# Patient Record
Sex: Male | Born: 1965 | Race: White | Hispanic: No | Smoking: Never smoker
Health system: Southern US, Community
[De-identification: ages and names within clinical notes are randomized; demographics above are authoritative.]

## PROBLEM LIST (undated history)

## (undated) DIAGNOSIS — I10 Essential (primary) hypertension: Secondary | ICD-10-CM

## (undated) HISTORY — PX: HERNIA REPAIR: SHX51

## (undated) HISTORY — DX: Essential (primary) hypertension: I10

---
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
## 2012-02-21 ENCOUNTER — Other Ambulatory Visit (HOSPITAL_COMMUNITY): Payer: Self-pay | Admitting: Sports Medicine

## 2012-02-21 ENCOUNTER — Ambulatory Visit (HOSPITAL_COMMUNITY)
Admission: RE | Admit: 2012-02-21 | Discharge: 2012-02-21 | Disposition: A | Discharge status: Home / Self Care | Payer: PRIVATE HEALTH INSURANCE | Source: Ambulatory Visit | Attending: Sports Medicine | Admitting: Sports Medicine

## 2012-02-21 DIAGNOSIS — M25572 Pain in left ankle and joints of left foot: Secondary | ICD-10-CM

## 2012-02-21 DIAGNOSIS — Z1389 Encounter for screening for other disorder: Secondary | ICD-10-CM | POA: Insufficient documentation

## 2012-02-21 DIAGNOSIS — H0550 Retained (old) foreign body following penetrating wound of unspecified orbit: Secondary | ICD-10-CM | POA: Insufficient documentation

## 2012-02-28 ENCOUNTER — Ambulatory Visit (HOSPITAL_COMMUNITY)
Admission: RE | Admit: 2012-02-28 | Discharge: 2012-02-28 | Disposition: A | Discharge status: Home / Self Care | Payer: PRIVATE HEALTH INSURANCE | Source: Ambulatory Visit | Attending: Sports Medicine | Admitting: Sports Medicine

## 2012-02-28 ENCOUNTER — Other Ambulatory Visit (HOSPITAL_COMMUNITY): Payer: Self-pay | Admitting: Sports Medicine

## 2012-02-28 DIAGNOSIS — Z1389 Encounter for screening for other disorder: Secondary | ICD-10-CM | POA: Insufficient documentation

## 2012-02-28 DIAGNOSIS — R52 Pain, unspecified: Secondary | ICD-10-CM

## 2012-02-28 DIAGNOSIS — M948X9 Other specified disorders of cartilage, unspecified sites: Secondary | ICD-10-CM | POA: Insufficient documentation

## 2018-03-14 HISTORY — PX: SKIN CANCER EXCISION: SHX779

## 2019-01-22 ENCOUNTER — Ambulatory Visit: Payer: BC Managed Care – PPO | Admitting: Plastic Surgery

## 2019-01-22 ENCOUNTER — Other Ambulatory Visit: Payer: Self-pay

## 2019-01-22 ENCOUNTER — Encounter: Payer: Self-pay | Admitting: Plastic Surgery

## 2019-01-22 DIAGNOSIS — M952 Other acquired deformity of head: Secondary | ICD-10-CM

## 2019-01-22 DIAGNOSIS — Z9889 Other specified postprocedural states: Secondary | ICD-10-CM

## 2019-01-22 HISTORY — DX: Other specified postprocedural states: Z98.890

## 2019-01-22 NOTE — Progress Notes (Signed)
     Patient ID: Tony Cook, male    DOB: Nov 09, 1965, 53 y.o.   MRN: [id number]   Chief Complaint  Patient presents with  . Advice Only    for MOHS repair    The patient is a 53 year old man here with family for evaluation of the scalp.  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  The margins were positive at the time and he went on to radiation.  He has 2 more weeks of radiation scheduled.  The area is 4.5 x 5 cm with bone exposed.  There is an additional 2 cm circumferentially of radiation damage.  Does not appear infected.  The bone does appear dried out and dark in color.  He is otherwise in good health.  He states that he stopped smoking years ago.  He has been using Neosporin to the area and washing it daily.  It is slightly tender to touch.  He has hair loss that includes the wound and the 2 cm circumferential area.   Review of Systems  Constitutional: Negative.  Negative for activity change.  HENT: Negative.   Eyes: Negative.   Respiratory: Negative.  Negative for chest tightness.   Cardiovascular: Negative.   Gastrointestinal: Negative.   Endocrine: Negative.   Genitourinary: Negative.   Musculoskeletal: Negative.   Skin: Positive for color change and wound.  Neurological: Negative.   Hematological: Negative.   Psychiatric/Behavioral: Negative.     History reviewed. No pertinent past medical history.  History reviewed. No pertinent surgical history.   No current outpatient medications on file.   Objective:   Vitals:   01/22/19 1334  BP: (!) 167/92  Pulse: 71  Temp: 98.2 F (36.8 C)  SpO2: 99%    Physical Exam Vitals signs and nursing note reviewed.  Constitutional:      Appearance: Normal appearance.  HENT:     Head: Normocephalic.  Cardiovascular:     Rate and Rhythm: Normal rate.     Pulses: Normal pulses.  Pulmonary:     Effort: Pulmonary effort is normal.  Abdominal:     General: Abdomen is flat. There is no distension.   Tenderness: There is no abdominal tenderness.  Neurological:     General: No focal deficit present.     Mental Status: He is alert.  Psychiatric:        Mood and Affect: Mood normal.        Behavior: Behavior normal.     Assessment & Plan:  Mohs defect of left scalp  We discussed the options for reconstruction.  Due to his radiation it will make things tricky and his healing process much slower than it would have otherwise been.  Recommend debridement of the area with burring down of bone at the outer table and then placement of ACell.  With the amount of radiation damage I am concerned about doing a skin flap as it would have to be large enough to be outside the radiated area.  Pictures were obtained of the patient and placed in the chart with the patient's or guardian's permission.  [place], DO

## 2019-03-04 ENCOUNTER — Encounter (HOSPITAL_BASED_OUTPATIENT_CLINIC_OR_DEPARTMENT_OTHER): Payer: Self-pay | Admitting: Plastic Surgery

## 2019-03-04 ENCOUNTER — Other Ambulatory Visit: Payer: Self-pay

## 2019-03-04 ENCOUNTER — Telehealth: Payer: Self-pay | Admitting: Plastic Surgery

## 2019-03-04 NOTE — Telephone Encounter (Signed)

## 2019-03-05 ENCOUNTER — Encounter: Payer: Self-pay | Admitting: Nurse Practitioner

## 2019-03-05 ENCOUNTER — Ambulatory Visit (INDEPENDENT_AMBULATORY_CARE_PROVIDER_SITE_OTHER): Payer: BC Managed Care – PPO | Admitting: Nurse Practitioner

## 2019-03-05 VITALS — BP 159/90 | HR 83 | Temp 97.3°F | Ht 67.0 in | Wt 172.6 lb

## 2019-03-05 DIAGNOSIS — Z9889 Other specified postprocedural states: Secondary | ICD-10-CM

## 2019-03-05 DIAGNOSIS — M952 Other acquired deformity of head: Secondary | ICD-10-CM

## 2019-03-05 MED ORDER — HYDROCODONE-ACETAMINOPHEN 5-325 MG PO TABS: 1.0000 | ORAL_TABLET | Freq: Four times a day (QID) | ORAL | 0 refills | Status: AC | PRN

## 2019-03-05 NOTE — Progress Notes (Signed)
ICD-10-CM   1. Mohs defect of left scalp  M95.2      History of Present Illness: Tony Cook is a 53 y.o.  male  with a history of squamous cell carcinoma of the scalp. He underwent an excision of the area in September 2020. The margins were positive and he underwent 6 weeks of radiation. There is a 5.2 cm x 4.5 cm area of exposed bone. He has been covering the wound with vaseline and gauze. He presents with his sister for preoperative evaluation for his upcoming procedure, Debridement of Moh's defect of scalp with Integra placement, scheduled for 03/13/19 with Dr. Ulice Bold.  No other past medical history. Previous surgical history includes; inguinal hernia repair as a child. Patient does not recall any adverse reactions to anesthesia. Denies any family history of adverse reactions to anesthesia. Patient denies any personal or family history of blood clots. Patient denies any history of stroke or MI. Patient uses snuff tobacco. Patient drinks 5-6 cans of beer and "an occasional shot" per day. Patient began taking a multivitamin and vitamin C last month. Patient states he was instructed by pre-op staff to stop taking all medications until after surgery. Patient states he only needed "stronger" pain medication for about 3 days after the excision.    Past Medical History: Allergies: No Known Allergies  Current Medications:  Current Outpatient Medications:  .  magnesium 30 MG tablet, Take 30 mg by mouth 2 (two) times daily., Disp: , Rfl:  .  Multiple Vitamins-Minerals (MULTIVITAMIN WITH MINERALS) tablet, Take 1 tablet by mouth daily., Disp: , Rfl:  .  vitamin B-12 (CYANOCOBALAMIN) 500 MCG tablet, Take 500 mcg by mouth daily., Disp: , Rfl:  .  vitamin C (ASCORBIC ACID) 250 MG tablet, Take 250 mg by mouth daily., Disp: , Rfl:   Past Medical Problems: History reviewed. No pertinent past medical history.  Past Surgical History: Past Surgical History:  Procedure Laterality Date  . HERNIA  REPAIR      The patient has had anesthesia or sedation in the past.   The patient has NOT had problems with anesthesia.  The patient does NOT have a family history of anesthesia problems.    Social History: Social History   Socioeconomic History  . Marital status: Divorced    Spouse name: Not on file  . Number of children: Not on file  . Years of education: Not on file  . Highest education level: Not on file  Occupational History  . Not on file  Tobacco Use  . Smoking status: Never Smoker  . Smokeless tobacco: Current User    Types: Snuff  Substance and Sexual Activity  . Alcohol use: Yes  . Drug use: Never  . Sexual activity: Not on file  Other Topics Concern  . Not on file  Social History Narrative  . Not on file   Social Determinants of Health   Financial Resource Strain:   . Difficulty of Paying Living Expenses: Not on file  Food Insecurity:   . Worried About Programme researcher, broadcasting/film/video in the Last Year: Not on file  . Ran Out of Food in the Last Year: Not on file  Transportation Needs:   . Lack of Transportation (Medical): Not on file  . Lack of Transportation (Non-Medical): Not on file  Physical Activity:   . Days of Exercise per Week: Not on file  . Minutes of Exercise per Session: Not on file  Stress:   . Feeling of Stress : Not  on file  Social Connections:   . Frequency of Communication with Friends and Family: Not on file  . Frequency of Social Gatherings with Friends and Family: Not on file  . Attends Religious Services: Not on file  . Active Member of Clubs or Organizations: Not on file  . Attends Archivist Meetings: Not on file  . Marital Status: Not on file  Intimate Partner Violence:   . Fear of Current or Ex-Partner: Not on file  . Emotionally Abused: Not on file  . Physically Abused: Not on file  . Sexually Abused: Not on file    Family History: History reviewed. No pertinent family history.  Review of Systems: General ROS:  negative Dermatological ROS: positive for scalp exicisonal wound Cardiovascular ROS: no chest pain or dyspnea on exertion ENT ROS: negative Gastrointestinal ROS: no abdominal pain, change in bowel habits, or black or bloody stools  Physical Exam: Vital Signs BP (!) 159/90 (BP Location: Left Arm, Patient Position: Sitting, Cuff Size: Large)   Pulse 83   Temp (!) 97.3 F (36.3 C) (Temporal)   Ht 5\' 7"  (1.702 m)   Wt 172 lb 9.6 oz (78.3 kg)   SpO2 100%   BMI 27.03 kg/m  General: awake, alert, appears stated age, no acute distress HEENT: 5.2 cm x 4.5 cm scalp wound with exposed bone, mild erythema along wound borders Neck: supple, full ROM Chest: symmetrical rise and fall Cardiac: regular rate and rhythm, +2 radial pulses bilaterally Lungs: CTA throughout Abdomen: soft, non-distended, non-tender Musculoskeletal: MAE x4 Neuro: A&Ox3 Skin: warm, dry  Assessment: 53 y.o.male  with a history of squamous cell carcinoma of the scalp s/p excision and radiation. The surrounding skin erythema has significantly improved since his initial consultation. No other significant past medical history.   Caprini score=3, patient will require mechanical prophylaxis during surgery  Plan: Patient scheduled for Debridement of Moh's defect of scalp with Integra placement on 03/13/19 with Dr. Marla Roe.  COVID-19 pre-procedure test scheduled for 03/11/19. Patient understands he must quarantine until surgery date following COVID test. Post-op visit scheduled for 03/22/19 and 03/29/19. Discussed importance of increased protein and decreased carb and sugar intake for wound healing. Post-op instruction handout provided. Post-op medications ordered. Patient instructed to resume vitamins after surgery.   Risks, benefits, and alternatives of procedure discussed, expected recovery, and questions answered.   The risks that can be encountered with and after a debridement were discussed and include the following but not  limited to these: bleeding, infection, delayed healing, anesthesia risks, skin sensation changes, injury to structures including nerves, blood vessels, and muscles which may be temporary or permanent, allergies to tape, suture materials and glues, blood products, topical preparations or injected agents, skin contour irregularities, skin discoloration and swelling, deep vein thrombosis, cardiac and pulmonary complications, pain, which may persist, and possible need for revisional surgery or staged procedures.    Electronically signed by: Alfredo Batty, NP 03/05/2019 1:40 PM

## 2019-03-05 NOTE — H&P (View-Only) (Signed)
ICD-10-CM   1. Mohs defect of left scalp  M95.2      History of Present Illness: Tony Cook is a 53 y.o.  male  with a history of squamous cell carcinoma of the scalp. He underwent an excision of the area in September 2020. The margins were positive and he underwent 6 weeks of radiation. There is a 5.2 cm x 4.5 cm area of exposed bone. He has been covering the wound with vaseline and gauze. He presents with his sister for preoperative evaluation for his upcoming procedure, Debridement of Moh's defect of scalp with Integra placement, scheduled for 03/13/19 with Dr. Ulice Bold.  No other past medical history. Previous surgical history includes; inguinal hernia repair as a child. Patient does not recall any adverse reactions to anesthesia. Denies any family history of adverse reactions to anesthesia. Patient denies any personal or family history of blood clots. Patient denies any history of stroke or MI. Patient uses snuff tobacco. Patient drinks 5-6 cans of beer and "an occasional shot" per day. Patient began taking a multivitamin and vitamin C last month. Patient states he was instructed by pre-op staff to stop taking all medications until after surgery. Patient states he only needed "stronger" pain medication for about 3 days after the excision.    Past Medical History: Allergies: No Known Allergies  Current Medications:  Current Outpatient Medications:  .  magnesium 30 MG tablet, Take 30 mg by mouth 2 (two) times daily., Disp: , Rfl:  .  Multiple Vitamins-Minerals (MULTIVITAMIN WITH MINERALS) tablet, Take 1 tablet by mouth daily., Disp: , Rfl:  .  vitamin B-12 (CYANOCOBALAMIN) 500 MCG tablet, Take 500 mcg by mouth daily., Disp: , Rfl:  .  vitamin C (ASCORBIC ACID) 250 MG tablet, Take 250 mg by mouth daily., Disp: , Rfl:   Past Medical Problems: History reviewed. No pertinent past medical history.  Past Surgical History: Past Surgical History:  Procedure Laterality Date  . HERNIA  REPAIR      The patient has had anesthesia or sedation in the past.   The patient has NOT had problems with anesthesia.  The patient does NOT have a family history of anesthesia problems.    Social History: Social History   Socioeconomic History  . Marital status: Divorced    Spouse name: Not on file  . Number of children: Not on file  . Years of education: Not on file  . Highest education level: Not on file  Occupational History  . Not on file  Tobacco Use  . Smoking status: Never Smoker  . Smokeless tobacco: Current User    Types: Snuff  Substance and Sexual Activity  . Alcohol use: Yes  . Drug use: Never  . Sexual activity: Not on file  Other Topics Concern  . Not on file  Social History Narrative  . Not on file   Social Determinants of Health   Financial Resource Strain:   . Difficulty of Paying Living Expenses: Not on file  Food Insecurity:   . Worried About Programme researcher, broadcasting/film/video in the Last Year: Not on file  . Ran Out of Food in the Last Year: Not on file  Transportation Needs:   . Lack of Transportation (Medical): Not on file  . Lack of Transportation (Non-Medical): Not on file  Physical Activity:   . Days of Exercise per Week: Not on file  . Minutes of Exercise per Session: Not on file  Stress:   . Feeling of Stress : Not  on file  Social Connections:   . Frequency of Communication with Friends and Family: Not on file  . Frequency of Social Gatherings with Friends and Family: Not on file  . Attends Religious Services: Not on file  . Active Member of Clubs or Organizations: Not on file  . Attends Archivist Meetings: Not on file  . Marital Status: Not on file  Intimate Partner Violence:   . Fear of Current or Ex-Partner: Not on file  . Emotionally Abused: Not on file  . Physically Abused: Not on file  . Sexually Abused: Not on file    Family History: History reviewed. No pertinent family history.  Review of Systems: General ROS:  negative Dermatological ROS: positive for scalp exicisonal wound Cardiovascular ROS: no chest pain or dyspnea on exertion ENT ROS: negative Gastrointestinal ROS: no abdominal pain, change in bowel habits, or black or bloody stools  Physical Exam: Vital Signs BP (!) 159/90 (BP Location: Left Arm, Patient Position: Sitting, Cuff Size: Large)   Pulse 83   Temp (!) 97.3 F (36.3 C) (Temporal)   Ht 5\' 7"  (1.702 m)   Wt 172 lb 9.6 oz (78.3 kg)   SpO2 100%   BMI 27.03 kg/m  General: awake, alert, appears stated age, no acute distress HEENT: 5.2 cm x 4.5 cm scalp wound with exposed bone, mild erythema along wound borders Neck: supple, full ROM Chest: symmetrical rise and fall Cardiac: regular rate and rhythm, +2 radial pulses bilaterally Lungs: CTA throughout Abdomen: soft, non-distended, non-tender Musculoskeletal: MAE x4 Neuro: A&Ox3 Skin: warm, dry  Assessment: 53 y.o.male  with a history of squamous cell carcinoma of the scalp s/p excision and radiation. The surrounding skin erythema has significantly improved since his initial consultation. No other significant past medical history.   Caprini score=3, patient will require mechanical prophylaxis during surgery  Plan: Patient scheduled for Debridement of Moh's defect of scalp with Integra placement on 03/13/19 with Dr. Marla Roe.  COVID-19 pre-procedure test scheduled for 03/11/19. Patient understands he must quarantine until surgery date following COVID test. Post-op visit scheduled for 03/22/19 and 03/29/19. Discussed importance of increased protein and decreased carb and sugar intake for wound healing. Post-op instruction handout provided. Post-op medications ordered. Patient instructed to resume vitamins after surgery.   Risks, benefits, and alternatives of procedure discussed, expected recovery, and questions answered.   The risks that can be encountered with and after a debridement were discussed and include the following but not  limited to these: bleeding, infection, delayed healing, anesthesia risks, skin sensation changes, injury to structures including nerves, blood vessels, and muscles which may be temporary or permanent, allergies to tape, suture materials and glues, blood products, topical preparations or injected agents, skin contour irregularities, skin discoloration and swelling, deep vein thrombosis, cardiac and pulmonary complications, pain, which may persist, and possible need for revisional surgery or staged procedures.    Electronically signed by: Alfredo Batty, NP 03/05/2019 1:40 PM

## 2019-03-11 ENCOUNTER — Other Ambulatory Visit (HOSPITAL_COMMUNITY)
Admission: RE | Admit: 2019-03-11 | Discharge: 2019-03-11 | Disposition: A | Discharge status: Home / Self Care | Payer: BC Managed Care – PPO | Source: Ambulatory Visit | Attending: Plastic Surgery | Admitting: Plastic Surgery

## 2019-03-11 DIAGNOSIS — Z01812 Encounter for preprocedural laboratory examination: Secondary | ICD-10-CM | POA: Insufficient documentation

## 2019-03-11 DIAGNOSIS — Z20828 Contact with and (suspected) exposure to other viral communicable diseases: Secondary | ICD-10-CM | POA: Insufficient documentation

## 2019-03-11 LAB — SARS CORONAVIRUS 2 (TAT 6-24 HRS): SARS Coronavirus 2: NEGATIVE

## 2019-03-13 ENCOUNTER — Encounter (HOSPITAL_BASED_OUTPATIENT_CLINIC_OR_DEPARTMENT_OTHER)
Admission: RE | Disposition: A | Discharge status: Home / Self Care | Payer: Self-pay | Source: Home / Self Care | Attending: Plastic Surgery

## 2019-03-13 ENCOUNTER — Ambulatory Visit (HOSPITAL_BASED_OUTPATIENT_CLINIC_OR_DEPARTMENT_OTHER)
Admission: RE | Admit: 2019-03-13 | Discharge: 2019-03-13 | Disposition: A | Discharge status: Home / Self Care | Payer: BC Managed Care – PPO | Attending: Plastic Surgery | Admitting: Plastic Surgery

## 2019-03-13 ENCOUNTER — Ambulatory Visit (HOSPITAL_BASED_OUTPATIENT_CLINIC_OR_DEPARTMENT_OTHER): Payer: BC Managed Care – PPO | Admitting: Anesthesiology

## 2019-03-13 ENCOUNTER — Other Ambulatory Visit: Payer: Self-pay

## 2019-03-13 ENCOUNTER — Encounter (HOSPITAL_BASED_OUTPATIENT_CLINIC_OR_DEPARTMENT_OTHER): Payer: Self-pay | Admitting: Plastic Surgery

## 2019-03-13 DIAGNOSIS — Z923 Personal history of irradiation: Secondary | ICD-10-CM | POA: Diagnosis not present

## 2019-03-13 DIAGNOSIS — F17290 Nicotine dependence, other tobacco product, uncomplicated: Secondary | ICD-10-CM | POA: Diagnosis not present

## 2019-03-13 DIAGNOSIS — M952 Other acquired deformity of head: Secondary | ICD-10-CM

## 2019-03-13 DIAGNOSIS — Z85828 Personal history of other malignant neoplasm of skin: Secondary | ICD-10-CM | POA: Insufficient documentation

## 2019-03-13 HISTORY — PX: INCISION AND DRAINAGE OF WOUND: SHX1803

## 2019-03-13 SURGERY — IRRIGATION AND DEBRIDEMENT WOUND
Anesthesia: General | Site: Scalp | Laterality: Left

## 2019-03-13 MED ORDER — FENTANYL CITRATE (PF) 100 MCG/2ML IJ SOLN
25.0000 ug | INTRAMUSCULAR | Status: DC | PRN
  Administered 2019-03-13 (×2): 50 ug via INTRAVENOUS

## 2019-03-13 MED ORDER — FENTANYL CITRATE (PF) 100 MCG/2ML IJ SOLN
INTRAMUSCULAR | Status: AC
  Filled 2019-03-13: qty 2

## 2019-03-13 MED ORDER — ACETAMINOPHEN 500 MG PO TABS
ORAL_TABLET | ORAL | Status: AC
  Filled 2019-03-13: qty 2

## 2019-03-13 MED ORDER — SODIUM CHLORIDE 0.9% FLUSH: 3.0000 mL | INTRAVENOUS | Status: DC | PRN

## 2019-03-13 MED ORDER — ACETAMINOPHEN 650 MG RE SUPP: 650.0000 mg | RECTAL | Status: DC | PRN

## 2019-03-13 MED ORDER — PROPOFOL 10 MG/ML IV BOLUS
INTRAVENOUS | Status: AC
  Filled 2019-03-13: qty 20

## 2019-03-13 MED ORDER — KETOROLAC TROMETHAMINE 30 MG/ML IJ SOLN: 30.0000 mg | Freq: Once | INTRAMUSCULAR | Status: DC | PRN

## 2019-03-13 MED ORDER — CHLORHEXIDINE GLUCONATE CLOTH 2 % EX PADS: 6.0000 | MEDICATED_PAD | Freq: Once | CUTANEOUS | Status: DC

## 2019-03-13 MED ORDER — SODIUM CHLORIDE 0.9 % IV SOLN: 250.0000 mL | INTRAVENOUS | Status: DC | PRN

## 2019-03-13 MED ORDER — CEFAZOLIN SODIUM-DEXTROSE 2-4 GM/100ML-% IV SOLN
2.0000 g | INTRAVENOUS | Status: AC
  Administered 2019-03-13: 2 g via INTRAVENOUS

## 2019-03-13 MED ORDER — OXYCODONE HCL 5 MG PO TABS
ORAL_TABLET | ORAL | Status: AC
  Filled 2019-03-13: qty 1

## 2019-03-13 MED ORDER — OXYCODONE HCL 5 MG PO TABS: 5.0000 mg | ORAL_TABLET | ORAL | Status: DC | PRN

## 2019-03-13 MED ORDER — MIDAZOLAM HCL 2 MG/2ML IJ SOLN
INTRAMUSCULAR | Status: AC
  Filled 2019-03-13: qty 2

## 2019-03-13 MED ORDER — LACTATED RINGERS IV SOLN: INTRAVENOUS | Status: DC

## 2019-03-13 MED ORDER — SODIUM CHLORIDE 0.9% FLUSH: 3.0000 mL | Freq: Two times a day (BID) | INTRAVENOUS | Status: DC

## 2019-03-13 MED ORDER — CEFAZOLIN SODIUM-DEXTROSE 2-4 GM/100ML-% IV SOLN
INTRAVENOUS | Status: AC
  Filled 2019-03-13: qty 100

## 2019-03-13 MED ORDER — ACETAMINOPHEN 500 MG PO TABS
1000.0000 mg | ORAL_TABLET | Freq: Once | ORAL | Status: AC
  Administered 2019-03-13: 09:00:00 1000 mg via ORAL

## 2019-03-13 MED ORDER — MIDAZOLAM HCL 5 MG/5ML IJ SOLN
INTRAMUSCULAR | Status: DC | PRN
  Administered 2019-03-13: 2 mg via INTRAVENOUS

## 2019-03-13 MED ORDER — LIDOCAINE HCL (CARDIAC) PF 100 MG/5ML IV SOSY
PREFILLED_SYRINGE | INTRAVENOUS | Status: DC | PRN
  Administered 2019-03-13: 70 mg via INTRAVENOUS

## 2019-03-13 MED ORDER — MEPERIDINE HCL 25 MG/ML IJ SOLN: 6.2500 mg | INTRAMUSCULAR | Status: DC | PRN

## 2019-03-13 MED ORDER — PROMETHAZINE HCL 25 MG/ML IJ SOLN: 6.2500 mg | INTRAMUSCULAR | Status: DC | PRN

## 2019-03-13 MED ORDER — DEXAMETHASONE SODIUM PHOSPHATE 4 MG/ML IJ SOLN
INTRAMUSCULAR | Status: DC | PRN
  Administered 2019-03-13: 10 mg via INTRAVENOUS

## 2019-03-13 MED ORDER — ARTIFICIAL TEARS OPHTHALMIC OINT
TOPICAL_OINTMENT | OPHTHALMIC | Status: AC
  Filled 2019-03-13: qty 3.5

## 2019-03-13 MED ORDER — LIDOCAINE-EPINEPHRINE 1 %-1:100000 IJ SOLN
INTRAMUSCULAR | Status: DC | PRN
  Administered 2019-03-13: 15 mL

## 2019-03-13 MED ORDER — HYDROMORPHONE HCL 1 MG/ML IJ SOLN: 0.2500 mg | INTRAMUSCULAR | Status: DC | PRN

## 2019-03-13 MED ORDER — ACETAMINOPHEN 325 MG PO TABS: 650.0000 mg | ORAL_TABLET | ORAL | Status: DC | PRN

## 2019-03-13 MED ORDER — PROPOFOL 10 MG/ML IV BOLUS
INTRAVENOUS | Status: DC | PRN
  Administered 2019-03-13: 200 mg via INTRAVENOUS

## 2019-03-13 MED ORDER — OXYCODONE HCL 5 MG/5ML PO SOLN: 5.0000 mg | Freq: Once | ORAL | Status: AC | PRN

## 2019-03-13 MED ORDER — OXYCODONE HCL 5 MG PO TABS
5.0000 mg | ORAL_TABLET | Freq: Once | ORAL | Status: AC | PRN
  Administered 2019-03-13: 5 mg via ORAL

## 2019-03-13 MED ORDER — FENTANYL CITRATE (PF) 100 MCG/2ML IJ SOLN
INTRAMUSCULAR | Status: DC | PRN
  Administered 2019-03-13: 50 ug via INTRAVENOUS

## 2019-03-13 SURGICAL SUPPLY — 73 items
BAG DECANTER FOR FLEXI CONT (MISCELLANEOUS) IMPLANT
BENZOIN TINCTURE PRP APPL 2/3 (GAUZE/BANDAGES/DRESSINGS) IMPLANT
BLADE HEX COATED 2.75 (ELECTRODE) IMPLANT
BLADE SURG 10 STRL SS (BLADE) IMPLANT
BLADE SURG 15 STRL LF DISP TIS (BLADE) ×1 IMPLANT
BLADE SURG 15 STRL SS (BLADE) ×2
BUR EGG 3PK/BX (BURR) ×3 IMPLANT
BUR PEAR (BURR) ×3 IMPLANT
CANISTER SUCT 1200ML W/VALVE (MISCELLANEOUS) ×3 IMPLANT
CLOSURE WOUND 1/2 X4 (GAUZE/BANDAGES/DRESSINGS) ×1
COVER BACK TABLE REUSABLE LG (DRAPES) ×3 IMPLANT
COVER MAYO STAND REUSABLE (DRAPES) ×3 IMPLANT
COVER WAND RF STERILE (DRAPES) IMPLANT
DECANTER SPIKE VIAL GLASS SM (MISCELLANEOUS) ×3 IMPLANT
DERMABOND ADVANCED (GAUZE/BANDAGES/DRESSINGS)
DERMABOND ADVANCED .7 DNX12 (GAUZE/BANDAGES/DRESSINGS) IMPLANT
DRAIN CHANNEL 19F RND (DRAIN) IMPLANT
DRAIN PENROSE 1/2X12 LTX STRL (WOUND CARE) IMPLANT
DRAPE HALF SHEET 70X43 (DRAPES) IMPLANT
DRAPE INCISE IOBAN 66X45 STRL (DRAPES) IMPLANT
DRAPE LAPAROSCOPIC ABDOMINAL (DRAPES) IMPLANT
DRAPE LAPAROTOMY 100X72 PEDS (DRAPES) IMPLANT
DRSG ADAPTIC 3X8 NADH LF (GAUZE/BANDAGES/DRESSINGS) IMPLANT
DRSG EMULSION OIL 3X3 NADH (GAUZE/BANDAGES/DRESSINGS) ×3 IMPLANT
DRSG PAD ABDOMINAL 8X10 ST (GAUZE/BANDAGES/DRESSINGS) IMPLANT
ELECT REM PT RETURN 9FT ADLT (ELECTROSURGICAL) ×3
ELECTRODE REM PT RTRN 9FT ADLT (ELECTROSURGICAL) ×1 IMPLANT
EVACUATOR SILICONE 100CC (DRAIN) IMPLANT
GAUZE SPONGE 4X4 12PLY STRL (GAUZE/BANDAGES/DRESSINGS) ×3 IMPLANT
GAUZE SPONGE 4X4 12PLY STRL LF (GAUZE/BANDAGES/DRESSINGS) IMPLANT
GAUZE XEROFORM 1X8 LF (GAUZE/BANDAGES/DRESSINGS) IMPLANT
GAUZE XEROFORM 5X9 LF (GAUZE/BANDAGES/DRESSINGS) IMPLANT
GLOVE BIO SURGEON STRL SZ 6.5 (GLOVE) ×12 IMPLANT
GLOVE BIO SURGEONS STRL SZ 6.5 (GLOVE) ×6
GOWN STRL REUS W/ TWL LRG LVL3 (GOWN DISPOSABLE) ×4 IMPLANT
GOWN STRL REUS W/TWL LRG LVL3 (GOWN DISPOSABLE) ×8
IV NS IRRIG 3000ML ARTHROMATIC (IV SOLUTION) IMPLANT
MANIFOLD NEPTUNE II (INSTRUMENTS) IMPLANT
MATRIX WOUND 3-LAYER 7X10 (Tissue) ×2 IMPLANT
MICROMATRIX 1000MG (Tissue) ×3 IMPLANT
NEEDLE HYPO 25X1 1.5 SAFETY (NEEDLE) IMPLANT
NS IRRIG 1000ML POUR BTL (IV SOLUTION) ×3 IMPLANT
PACK BASIN DAY SURGERY FS (CUSTOM PROCEDURE TRAY) ×3 IMPLANT
PENCIL SMOKE EVACUATOR (MISCELLANEOUS) ×3 IMPLANT
PIN SAFETY STERILE (MISCELLANEOUS) IMPLANT
SLEEVE SCD COMPRESS KNEE MED (MISCELLANEOUS) IMPLANT
SOLUTION PARTIC MCRMTRX 1000MG (Tissue) ×1 IMPLANT
SPONGE LAP 18X18 RF (DISPOSABLE) IMPLANT
STAPLER VISISTAT 35W (STAPLE) IMPLANT
STRIP CLOSURE SKIN 1/2X4 (GAUZE/BANDAGES/DRESSINGS) ×2 IMPLANT
SUCTION FRAZIER HANDLE 10FR (MISCELLANEOUS) ×2
SUCTION TUBE FRAZIER 10FR DISP (MISCELLANEOUS) ×1 IMPLANT
SURGILUBE 2OZ TUBE FLIPTOP (MISCELLANEOUS) ×3 IMPLANT
SUT MNCRL AB 4-0 PS2 18 (SUTURE) ×3 IMPLANT
SUT MON AB 3-0 SH 27 (SUTURE)
SUT MON AB 3-0 SH27 (SUTURE) IMPLANT
SUT SILK 3 0 PS 1 (SUTURE) IMPLANT
SUT VIC AB 3-0 FS2 27 (SUTURE) IMPLANT
SUT VIC AB 5-0 PS2 18 (SUTURE) IMPLANT
SUT VICRYL 4-0 PS2 18IN ABS (SUTURE) IMPLANT
SWAB COLLECTION DEVICE MRSA (MISCELLANEOUS) IMPLANT
SWAB CULTURE ESWAB REG 1ML (MISCELLANEOUS) IMPLANT
SYR BULB IRRIGATION 50ML (SYRINGE) ×3 IMPLANT
SYR CONTROL 10ML LL (SYRINGE) IMPLANT
TAPE HYPAFIX 6 X30' (GAUZE/BANDAGES/DRESSINGS)
TAPE HYPAFIX 6X30 (GAUZE/BANDAGES/DRESSINGS) IMPLANT
TOWEL GREEN STERILE FF (TOWEL DISPOSABLE) ×3 IMPLANT
TRAY DSU PREP LF (CUSTOM PROCEDURE TRAY) IMPLANT
TUBE CONNECTING 20'X1/4 (TUBING) ×1
TUBE CONNECTING 20X1/4 (TUBING) ×2 IMPLANT
UNDERPAD 30X36 HEAVY ABSORB (UNDERPADS AND DIAPERS) IMPLANT
WOUND MATRIX 3-LAYER 7X10 (Tissue) ×1 IMPLANT
YANKAUER SUCT BULB TIP NO VENT (SUCTIONS) ×3 IMPLANT

## 2019-03-13 NOTE — Discharge Instructions (Signed)
*Pt had 5mg  of Oxycodone at 1205pm  Wound Care with Acell  Guide to Wound Care  Proper wound care may reduce the risk of infection, improve healing rates, and limit scarring.  This is a general guide to help care for and manage wounds treated with ACell MicroMatrix?or Cytal Wound Matrix.   Dressing Changes The frequency of dressing changes can vary based on which product was applied, the size of the wound, or the amount of wound drainage. Dressing inspections are recommended, at least weekly.   If you have a Wound VAC it will be changed in one week after the first time it is applied.  Then it will be changed once or twice a week.   If you don't have a Wound VAC, then place KY gel on the wound daily and cover with gauze.  Dressing Types Primary Dressing:  Non-adherent dressing goes directly over wounds being treated with the powder or sheet (MicroMatrix and/or Cytal).  Secondary Dressing:  Secures the primary dressing in place and provides extra protection, compression, and absorption.  1. Wash Hands - To help decrease the risk of infection, caregivers should wash their hands for a minimum of 20 seconds and may use medical gloves.   2. Remove the Dressings - Avoid removing product from the wound by carefully removing the applicable dressing(s) at the time points recommended above, or as recommended by the treating physician.  Expected Color and Odor:  It is entirely normal for the wound to have an unpleasant odor and to form a caramel-colored gel as the product absorbs into the wound. It is  important to leave this gel on the wound site.  3. Clean the Wound - Use clean water or saline to gently rinse around the wound surface and remove any excess discharge that may be present on the wound. Do not wipe off any of the caramel-colored gel on the wound.   What to look out for:  Large or increased amount of drainage   Surrounding skin has worsening redness or hot to touch   Increased pain in  or around the wound   Flu-like symptoms, fatigue, decreased appetite, fever   Hard, crusty wound surface with black or brown coloring  4. Apply New Dressings - Dressings should cover the entire wound and be suitable for maintaining a moist wound environment.  The non-adherent mesh dressing should be left in place.  New dressing should consist of KY Jelly to keep the wound moist and soft gauze secured with a wrap or tape.   Maintain a Hydrated Wound Area It is important to keep the wound area moist throughout the healing process. If the wound appears to be dry during dressing changes, select a dressing that will hydrate the wound and maintain that ideal moist environment. If you are unsure what to do, ask the treating physician.  Remodeling Process Every patient heals differently, and no two cases are the same. The size and location of the wound, product type and layering configurations, and general patient health all contribute to how quickly a wound will heal.  While many factors can influence the rate at which the product absorbs, the following can be used as a general guide.   THINGS TO DO: Refrain from smoking High protein diet with plenty of vegetables and some fruit  Limit simple processed carbohydrates and sugar Protect the wound from trauma Protect the dressing  Micromatrix powder       Cytal Sheet  Sorbact dressing    Post Anesthesia Home Care Instructions  Next time you can take tylenol is 230pm    Activity: Get plenty of rest for the remainder of the day. A responsible individual must stay with you for 24 hours following the procedure.  For the next 24 hours, DO NOT: -Drive a car -Paediatric nurse -Drink alcoholic beverages -Take any medication unless instructed by your physician -Make any legal decisions or sign important papers.  Meals: Start with liquid foods such as gelatin or soup. Progress to regular foods as tolerated. Avoid greasy, spicy, heavy  foods. If nausea and/or vomiting occur, drink only clear liquids until the nausea and/or vomiting subsides. Call your physician if vomiting continues.  Special Instructions/Symptoms: Your throat may feel dry or sore from the anesthesia or the breathing tube placed in your throat during surgery. If this causes discomfort, gargle with warm salt water. The discomfort should disappear within 24 hours.  If you had a scopolamine patch placed behind your ear for the management of post- operative nausea and/or vomiting:  1. The medication in the patch is effective for 72 hours, after which it should be removed.  Wrap patch in a tissue and discard in the trash. Wash hands thoroughly with soap and water. 2. You may remove the patch earlier than 72 hours if you experience unpleasant side effects which may include dry mouth, dizziness or visual disturbances. 3. Avoid touching the patch. Wash your hands with soap and water after contact with the patch.

## 2019-03-13 NOTE — Transfer of Care (Signed)
Immediate Anesthesia Transfer of Care Note  Patient: Tony Cook  Procedure(s) Performed: Debridement of Mohs defect scalp with ACell or Integra placement (Left Scalp)  Patient Location: PACU  Anesthesia Type:General  Level of Consciousness: awake  Airway & Oxygen Therapy: Patient Spontanous Breathing and Patient connected to face mask oxygen  Post-op Assessment: Report given to RN and Post -op Vital signs reviewed and stable  Post vital signs: Reviewed and stable  Last Vitals:  Vitals Value Taken Time  BP    Temp    Pulse 77 03/13/19 1103  Resp 15 03/13/19 1103  SpO2 100 % 03/13/19 1103  Vitals shown include unvalidated device data.  Last Pain:  Vitals:   03/13/19 0812  TempSrc: Oral      Patients Stated Pain Goal: 3 (63/84/[id number])  Complications: No apparent anesthesia complications

## 2019-03-13 NOTE — Anesthesia Procedure Notes (Signed)
Procedure Name: LMA Insertion Date/Time: 03/13/2019 10:02 AM Performed by: Marrianne Mood, CRNA Pre-anesthesia Checklist: Patient identified, Emergency Drugs available, Suction available, Patient being monitored and Timeout performed Patient Re-evaluated:Patient Re-evaluated prior to induction Oxygen Delivery Method: Circle system utilized Preoxygenation: Pre-oxygenation with 100% oxygen Induction Type: IV induction Ventilation: Mask ventilation without difficulty LMA: LMA inserted LMA Size: 5.0 Number of attempts: 1 Airway Equipment and Method: Bite block Placement Confirmation: positive ETCO2 Tube secured with: Tape Dental Injury: Teeth and Oropharynx as per pre-operative assessment

## 2019-03-13 NOTE — Interval H&P Note (Signed)
History and Physical Interval Note:  03/13/2019 9:22 AM  Tony Cook  has presented today for surgery, with the diagnosis of Mohs defect of scalp.  The various methods of treatment have been discussed with the patient and family. After consideration of risks, benefits and other options for treatment, the patient has consented to  Procedure(s) with comments: Debridement of Mohs defect scalp with ACell or Integra placement (Left) - 1 hour as a surgical intervention.  The patient's history has been reviewed, patient examined, no change in status, stable for surgery.  I have reviewed the patient's chart and labs.  Questions were answered to the patient's satisfaction.     Loel Lofty Charrise Lardner

## 2019-03-13 NOTE — Op Note (Signed)
DATE OF OPERATION: 03/13/2019  LOCATION: [facility]  PREOPERATIVE DIAGNOSIS: scalp defect after skin cancer excision 5 x 7 cm  POSTOPERATIVE DIAGNOSIS: Same  PROCEDURE:  Excision of 5 x 7 cm bone and 0.5 x 10 cm skin  Placement of acell sheet 7 x 10 cm and 1 gm powder  SURGEON: Kensy Blizard [employer], DO  ASSISTANT: Phoebe Sharps, PA  EBL: 2 cc  CONDITION: Stable  COMPLICATIONS: None  INDICATION: The patient, Tony Cook, is a 53 y.o. male born on 1965/11/26, is here for treatment of a mohs defect after excision of skin cancer.  Margins are negative.   PROCEDURE DETAILS:  The patient was seen prior to surgery and marked.  The IV antibiotics were given. The patient was taken to the operating room and given a general anesthetic. A standard time out was performed and all information was confirmed by those in the room. SCDs were placed.   The scalp was prepped and draped.  Lidocaine with epinephrine was injected at the peripheral area of the wound.  The wound was 5 x 7 cm with full exposure of the skull.  The skull had severe dried out and denuded bone.  This was secondary to the radiation.  Peripheral margins entire wound was excised area 10 cm.  This was sent to pathology for margins.  The pineapple bur was used to excise the outer table of the entire 5 x 7 cm area of skull.  We got to punctate bleeding.  It was not brisk.  Pressure was used to obtain hemostasis.  The area was irrigated with saline.  All of the ACell powder and sheet was applied.  The sheet was doubled and sutured into place with 5-0 Vicryl.  The Adaptic was sutured on top of that.  KY gel was applied and a sterile dressing.  The patient was allowed to wake up and taken to recovery room in stable condition at the end of the case. The family was notified at the end of the case.   The advanced practice practitioner (APP) assisted throughout the case.  The APP was essential in retraction and counter  traction when needed to make the case progress smoothly.  This retraction and assistance made it possible to see the tissue plans for the procedure.  The assistance was needed for blood control, tissue re-approximation and assisted with closure of the incision site.  The [place] was signed into law in 2016 which includes the topic of electronic health records.  This provides immediate access to information in MyChart.  This includes consultation notes, operative notes, office notes, lab results and pathology reports.  If you have any questions about what you read please let us know at your next visit or call us at the office.  We are right here with you.

## 2019-03-13 NOTE — Anesthesia Postprocedure Evaluation (Signed)
Anesthesia Post Note  Patient: Tony Cook  Procedure(s) Performed: Debridement of Mohs defect scalp with ACell or Integra placement (Left Scalp)     Patient location during evaluation: PACU Anesthesia Type: General Level of consciousness: awake and alert, oriented and patient cooperative Pain management: pain level controlled Vital Signs Assessment: post-procedure vital signs reviewed and stable Respiratory status: spontaneous breathing, nonlabored ventilation and respiratory function stable Cardiovascular status: blood pressure returned to baseline and stable Postop Assessment: no apparent nausea or vomiting Anesthetic complications: no    Last Vitals:  Vitals:   03/13/19 1145 03/13/19 1210  BP: 123/81 (!) 154/72  Pulse: 67 65  Resp: (!) 8 18  Temp:  37 C  SpO2: 98% 100%    Last Pain:  Vitals:   03/13/19 1210  TempSrc:   PainSc: [place]

## 2019-03-13 NOTE — Anesthesia Preprocedure Evaluation (Addendum)
Anesthesia Evaluation  Patient identified by MRN, date of birth, ID band Patient awake    Reviewed: Allergy & Precautions, NPO status , Patient's Chart, lab work & pertinent test results  Airway Mallampati: II  TM Distance: >3 FB Neck ROM: Full    Dental no notable dental hx. (+) Teeth Intact, Dental Advisory Given   Pulmonary  Current smokeless tobacco   Pulmonary exam normal breath sounds clear to auscultation       Cardiovascular negative cardio ROS Normal cardiovascular exam Rhythm:Regular Rate:Normal     Neuro/Psych negative neurological ROS  negative psych ROS   GI/Hepatic negative GI ROS, Neg liver ROS,   Endo/Other  negative endocrine ROS  Renal/GU negative Renal ROS  negative genitourinary   Musculoskeletal negative musculoskeletal ROS (+)   Abdominal Normal abdominal exam  (+)   Peds  Hematology negative hematology ROS (+)   Anesthesia Other Findings Mohs defect of scalp  Reproductive/Obstetrics negative OB ROS                            Anesthesia Physical Anesthesia Plan  ASA: I  Anesthesia Plan: General   Post-op Pain Management:    Induction: Intravenous  PONV Risk Score and Plan: 2 and Ondansetron, Dexamethasone, Midazolam and Treatment may vary due to age or medical condition  Airway Management Planned: LMA  Additional Equipment: None  Intra-op Plan:   Post-operative Plan: Extubation in OR  Informed Consent: I have reviewed the patients History and Physical, chart, labs and discussed the procedure including the risks, benefits and alternatives for the proposed anesthesia with the patient or authorized representative who has indicated his/her understanding and acceptance.     Dental advisory given  Plan Discussed with: CRNA  Anesthesia Plan Comments:        Anesthesia Quick Evaluation

## 2019-03-14 ENCOUNTER — Telehealth: Payer: Self-pay

## 2019-03-14 LAB — SURGICAL PATHOLOGY

## 2019-03-14 NOTE — Addendum Note (Signed)
Addendum  created 03/14/19 0920 by Tysen Roesler, Ernesta Amble, CRNA   Charge Capture section accepted

## 2019-03-14 NOTE — Telephone Encounter (Signed)
Spoke with Mr. Littlejohn. He should not remove the base layer. It is adaptic and is sutured in place.  He should apply K-Y Jelly daily and cover with a new dressing.  He said that is what he is doing and will keep doing so.

## 2019-03-14 NOTE — Telephone Encounter (Signed)
Received call from patient. He states he removed his bandage yesterday and wants to know if he should remove the base layer.

## 2019-03-21 ENCOUNTER — Telehealth: Payer: Self-pay

## 2019-03-21 NOTE — Telephone Encounter (Signed)

## 2019-03-22 ENCOUNTER — Ambulatory Visit (INDEPENDENT_AMBULATORY_CARE_PROVIDER_SITE_OTHER): Payer: BC Managed Care – PPO | Admitting: Plastic Surgery

## 2019-03-22 ENCOUNTER — Other Ambulatory Visit: Payer: Self-pay

## 2019-03-22 ENCOUNTER — Encounter: Payer: Self-pay | Admitting: Plastic Surgery

## 2019-03-22 VITALS — BP 150/82 | HR 74 | Temp 97.7°F | Ht 68.0 in | Wt 168.6 lb

## 2019-03-22 DIAGNOSIS — M952 Other acquired deformity of head: Secondary | ICD-10-CM

## 2019-03-22 NOTE — Progress Notes (Signed)
   Subjective:    Patient ID: Tony Cook, male    DOB: [birth year], 54 y.o.   MRN: [id number]  The patient is a 55 year old male here for follow-up on his scalp wound.  It appears that the ACell is trying to incorporate.  It is difficult to tell.  Overall his scalp looks much much better.  There is less swelling and less redness.  He gets some pains in his head and seems to get relief with Goody powder.  There does not appear to be any sign of infection.     Review of Systems  Constitutional: Negative.   HENT: Negative.   Eyes: Negative.   Respiratory: Negative.   Cardiovascular: Negative.   Gastrointestinal: Negative.   Genitourinary: Negative.   Skin: Positive for color change and wound.  Hematological: Negative.        Objective:   Physical Exam Vitals and nursing note reviewed.  HENT:     Head: Normocephalic.  Cardiovascular:     Rate and Rhythm: Normal rate.  Skin:    Capillary Refill: Capillary refill takes less than 2 seconds.  Neurological:     General: No focal deficit present.     Mental Status: He is alert and oriented to person, place, and time.        Assessment & Plan:     ICD-10-CM   1. 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  Recommend K-Y jelly 1 or 2 times a day for the next 4 days.  Then can shower and change the Adaptic.  I had like to see him back in 2 weeks.  We will also go to see if we can get him on the schedule for placement of another ACell sheet.

## 2019-03-29 ENCOUNTER — Encounter: Payer: PRIVATE HEALTH INSURANCE | Admitting: Plastic Surgery

## 2019-04-05 ENCOUNTER — Encounter: Payer: Self-pay | Admitting: Plastic Surgery

## 2019-04-05 ENCOUNTER — Ambulatory Visit: Payer: BC Managed Care – PPO | Admitting: Plastic Surgery

## 2019-04-05 ENCOUNTER — Other Ambulatory Visit: Payer: Self-pay

## 2019-04-05 VITALS — BP 137/79 | HR 76 | Temp 98.0°F | Ht 68.0 in | Wt 169.6 lb

## 2019-04-05 DIAGNOSIS — M952 Other acquired deformity of head: Secondary | ICD-10-CM

## 2019-04-05 NOTE — Progress Notes (Signed)
   Subjective:    Patient ID: Tony Cook, male    DOB: [birth year], 54 y.o.   MRN: [id number]  The patient is a 54 year old male here for follow-up after undergoing his Mohs resection and radiation to his left scalp.  ACell was applied and he starting to get a little bit of granulation.  It is extremely slow.  This is not unexpected due to the radiation and the radiation being so recent.  It does not appear infected.  He is overall doing much better and the redness and swelling in the surrounding periwound area is much better     Review of Systems  Constitutional: Negative for activity change.  Eyes: Negative.   Respiratory: Negative.   Cardiovascular: Negative.   Gastrointestinal: Negative.   Genitourinary: Negative.   Musculoskeletal: Negative.        Objective:   Physical Exam Vitals and nursing note reviewed.  Constitutional:      Appearance: Normal appearance.  HENT:     Head:   Cardiovascular:     Rate and Rhythm: Normal rate.     Pulses: Normal pulses.  Pulmonary:     Effort: Pulmonary effort is normal.  Neurological:     General: No focal deficit present.     Mental Status: He is alert and oriented to person, place, and time.  Psychiatric:        Mood and Affect: Mood normal.        Behavior: Behavior normal.        Thought Content: Thought content normal.           Assessment & Plan:     ICD-10-CM   1. Mohs defect of left scalp  M95.2     Will return to OR for placement of Acell.  Need to get more granulation tissue.   May need to consider flap.  Pictures were obtained of the patient and placed in the chart with the patient's or guardian's permission.   The 21st Century Cures Act was signed into law in 2016 which includes the topic of electronic health records.  This provides immediate access to information in MyChart.  This includes consultation notes, operative notes, office notes, lab results and pathology reports.  If you have any questions  about what you read please let us know at your next visit or call us at the office.  We are right here with you.

## 2019-04-09 ENCOUNTER — Other Ambulatory Visit: Payer: Self-pay

## 2019-04-09 ENCOUNTER — Encounter (HOSPITAL_BASED_OUTPATIENT_CLINIC_OR_DEPARTMENT_OTHER): Payer: Self-pay | Admitting: Plastic Surgery

## 2019-04-11 ENCOUNTER — Telehealth: Payer: Self-pay | Admitting: Plastic Surgery

## 2019-04-11 NOTE — Telephone Encounter (Signed)
Returned patients call. Advised him that I will remind Dr. Ulice Bold to send in prescription tomorrow for his surgery on Monday

## 2019-04-11 NOTE — Telephone Encounter (Signed)
Pt stated the medications for after surgery have not been called in yet and he wanted to follow up on that.

## 2019-04-12 ENCOUNTER — Other Ambulatory Visit (HOSPITAL_COMMUNITY)
Admission: RE | Admit: 2019-04-12 | Discharge: 2019-04-12 | Disposition: A | Discharge status: Home / Self Care | Payer: BC Managed Care – PPO | Source: Ambulatory Visit | Attending: Plastic Surgery | Admitting: Plastic Surgery

## 2019-04-12 ENCOUNTER — Other Ambulatory Visit (INDEPENDENT_AMBULATORY_CARE_PROVIDER_SITE_OTHER): Payer: BC Managed Care – PPO | Admitting: Plastic Surgery

## 2019-04-12 DIAGNOSIS — Z20822 Contact with and (suspected) exposure to covid-19: Secondary | ICD-10-CM | POA: Diagnosis not present

## 2019-04-12 DIAGNOSIS — Z01812 Encounter for preprocedural laboratory examination: Secondary | ICD-10-CM | POA: Insufficient documentation

## 2019-04-12 LAB — SARS CORONAVIRUS 2 (TAT 6-24 HRS): SARS Coronavirus 2: NEGATIVE

## 2019-04-12 MED ORDER — CEPHALEXIN 500 MG PO CAPS: 500.0000 mg | ORAL_CAPSULE | Freq: Four times a day (QID) | ORAL | 0 refills | Status: AC

## 2019-04-12 MED ORDER — HYDROCODONE-ACETAMINOPHEN 5-325 MG PO TABS: 1.0000 | ORAL_TABLET | Freq: Three times a day (TID) | ORAL | 0 refills | Status: AC | PRN

## 2019-04-12 NOTE — H&P (View-Only) (Signed)
Placing Post-op Rx (Keflex & Norco) for upcoming surgery scheduled for 04/15/2019. 

## 2019-04-12 NOTE — Telephone Encounter (Signed)
Called patient back and advised Dr. Ulice Bold will give him the prescriptions the day of surgery.

## 2019-04-12 NOTE — Progress Notes (Signed)
Placing Post-op Rx (Keflex & Norco) for upcoming surgery scheduled for 04/15/2019.

## 2019-04-15 ENCOUNTER — Ambulatory Visit (HOSPITAL_BASED_OUTPATIENT_CLINIC_OR_DEPARTMENT_OTHER)
Admission: RE | Admit: 2019-04-15 | Discharge: 2019-04-15 | Disposition: A | Discharge status: Home / Self Care | Payer: BC Managed Care – PPO | Attending: Plastic Surgery | Admitting: Plastic Surgery

## 2019-04-15 ENCOUNTER — Encounter (HOSPITAL_BASED_OUTPATIENT_CLINIC_OR_DEPARTMENT_OTHER)
Admission: RE | Disposition: A | Discharge status: Home / Self Care | Payer: Self-pay | Source: Home / Self Care | Attending: Plastic Surgery

## 2019-04-15 ENCOUNTER — Encounter (HOSPITAL_BASED_OUTPATIENT_CLINIC_OR_DEPARTMENT_OTHER): Payer: Self-pay | Admitting: Plastic Surgery

## 2019-04-15 ENCOUNTER — Other Ambulatory Visit: Payer: Self-pay

## 2019-04-15 ENCOUNTER — Ambulatory Visit (HOSPITAL_BASED_OUTPATIENT_CLINIC_OR_DEPARTMENT_OTHER): Payer: BC Managed Care – PPO | Admitting: Certified Registered Nurse Anesthetist

## 2019-04-15 DIAGNOSIS — Z85828 Personal history of other malignant neoplasm of skin: Secondary | ICD-10-CM | POA: Diagnosis not present

## 2019-04-15 DIAGNOSIS — Z428 Encounter for other plastic and reconstructive surgery following medical procedure or healed injury: Secondary | ICD-10-CM | POA: Diagnosis not present

## 2019-04-15 DIAGNOSIS — M952 Other acquired deformity of head: Secondary | ICD-10-CM

## 2019-04-15 HISTORY — PX: APPLICATION OF A-CELL OF HEAD/NECK: SHX6304

## 2019-04-15 SURGERY — APPLICATION, ACELLULAR DERMAL REPLACEMENT, HEAD
Anesthesia: General | Site: Scalp

## 2019-04-15 MED ORDER — MIDAZOLAM HCL 2 MG/2ML IJ SOLN
INTRAMUSCULAR | Status: AC
  Filled 2019-04-15: qty 2

## 2019-04-15 MED ORDER — OXYCODONE HCL 5 MG/5ML PO SOLN: 5.0000 mg | Freq: Once | ORAL | Status: AC | PRN

## 2019-04-15 MED ORDER — ACETAMINOPHEN 650 MG RE SUPP: 650.0000 mg | RECTAL | Status: DC | PRN

## 2019-04-15 MED ORDER — FENTANYL CITRATE (PF) 100 MCG/2ML IJ SOLN
50.0000 ug | INTRAMUSCULAR | Status: DC | PRN
  Administered 2019-04-15: 08:00:00 25 ug via INTRAVENOUS
  Administered 2019-04-15: 08:00:00 50 ug via INTRAVENOUS

## 2019-04-15 MED ORDER — PROPOFOL 10 MG/ML IV BOLUS
INTRAVENOUS | Status: AC
  Filled 2019-04-15: qty 20

## 2019-04-15 MED ORDER — MIDAZOLAM HCL 2 MG/2ML IJ SOLN
1.0000 mg | INTRAMUSCULAR | Status: DC | PRN
  Administered 2019-04-15: 2 mg via INTRAVENOUS

## 2019-04-15 MED ORDER — FENTANYL CITRATE (PF) 100 MCG/2ML IJ SOLN
INTRAMUSCULAR | Status: AC
  Filled 2019-04-15: qty 2

## 2019-04-15 MED ORDER — OXYCODONE HCL 5 MG PO TABS
ORAL_TABLET | ORAL | Status: AC
  Filled 2019-04-15: qty 1

## 2019-04-15 MED ORDER — FENTANYL CITRATE (PF) 100 MCG/2ML IJ SOLN
25.0000 ug | INTRAMUSCULAR | Status: DC | PRN
  Administered 2019-04-15: 09:00:00 50 ug via INTRAVENOUS

## 2019-04-15 MED ORDER — PROMETHAZINE HCL 25 MG/ML IJ SOLN: 6.2500 mg | INTRAMUSCULAR | Status: DC | PRN

## 2019-04-15 MED ORDER — SODIUM CHLORIDE 0.9% FLUSH: 3.0000 mL | INTRAVENOUS | Status: DC | PRN

## 2019-04-15 MED ORDER — LIDOCAINE 2% (20 MG/ML) 5 ML SYRINGE
INTRAMUSCULAR | Status: DC | PRN
  Administered 2019-04-15: 80 mg via INTRAVENOUS

## 2019-04-15 MED ORDER — SODIUM CHLORIDE 0.9 % IV SOLN: 250.0000 mL | INTRAVENOUS | Status: DC | PRN

## 2019-04-15 MED ORDER — PROPOFOL 10 MG/ML IV BOLUS
INTRAVENOUS | Status: DC | PRN
  Administered 2019-04-15: 150 ug via INTRAVENOUS

## 2019-04-15 MED ORDER — DEXAMETHASONE SODIUM PHOSPHATE 10 MG/ML IJ SOLN
INTRAMUSCULAR | Status: DC | PRN
  Administered 2019-04-15: 10 mg via INTRAVENOUS

## 2019-04-15 MED ORDER — OXYCODONE HCL 5 MG PO TABS
5.0000 mg | ORAL_TABLET | Freq: Once | ORAL | Status: AC | PRN
  Administered 2019-04-15: 10:00:00 5 mg via ORAL

## 2019-04-15 MED ORDER — HYDROCODONE-ACETAMINOPHEN 5-325 MG PO TABS: 1.0000 | ORAL_TABLET | Freq: Three times a day (TID) | ORAL | 0 refills | Status: AC | PRN

## 2019-04-15 MED ORDER — ONDANSETRON HCL 4 MG/2ML IJ SOLN
INTRAMUSCULAR | Status: DC | PRN
  Administered 2019-04-15: 4 mg via INTRAVENOUS

## 2019-04-15 MED ORDER — LACTATED RINGERS IV SOLN: INTRAVENOUS | Status: DC

## 2019-04-15 MED ORDER — CHLORHEXIDINE GLUCONATE CLOTH 2 % EX PADS: 6.0000 | MEDICATED_PAD | Freq: Once | CUTANEOUS | Status: DC

## 2019-04-15 MED ORDER — SODIUM CHLORIDE 0.9% FLUSH: 3.0000 mL | Freq: Two times a day (BID) | INTRAVENOUS | Status: DC

## 2019-04-15 MED ORDER — OXYCODONE HCL 5 MG PO TABS: 5.0000 mg | ORAL_TABLET | ORAL | Status: DC | PRN

## 2019-04-15 MED ORDER — FENTANYL CITRATE (PF) 100 MCG/2ML IJ SOLN
25.0000 ug | INTRAMUSCULAR | Status: DC | PRN
  Administered 2019-04-15: 50 ug via INTRAVENOUS

## 2019-04-15 MED ORDER — CEFAZOLIN SODIUM-DEXTROSE 2-4 GM/100ML-% IV SOLN
2.0000 g | INTRAVENOUS | Status: AC
  Administered 2019-04-15: 2 g via INTRAVENOUS

## 2019-04-15 MED ORDER — SODIUM CHLORIDE 0.9 % IV SOLN
INTRAVENOUS | Status: DC | PRN
  Administered 2019-04-15: 08:00:00 500 mL

## 2019-04-15 MED ORDER — ACETAMINOPHEN 500 MG PO TABS: 1000.0000 mg | ORAL_TABLET | Freq: Once | ORAL | Status: DC

## 2019-04-15 MED ORDER — ACETAMINOPHEN 325 MG PO TABS: 650.0000 mg | ORAL_TABLET | ORAL | Status: DC | PRN

## 2019-04-15 MED ORDER — CEFAZOLIN SODIUM-DEXTROSE 2-4 GM/100ML-% IV SOLN
INTRAVENOUS | Status: AC
  Filled 2019-04-15: qty 100

## 2019-04-15 SURGICAL SUPPLY — 60 items
BAG DECANTER FOR FLEXI CONT (MISCELLANEOUS) ×4 IMPLANT
BLADE HEX COATED 2.75 (ELECTRODE) IMPLANT
BLADE SURG 10 STRL SS (BLADE) IMPLANT
BLADE SURG 15 STRL LF DISP TIS (BLADE) ×2 IMPLANT
BLADE SURG 15 STRL SS (BLADE) ×2
BNDG COHESIVE 4X5 TAN STRL (GAUZE/BANDAGES/DRESSINGS) IMPLANT
BNDG ELASTIC 3X5.8 VLCR STR LF (GAUZE/BANDAGES/DRESSINGS) IMPLANT
BNDG ELASTIC 4X5.8 VLCR STR LF (GAUZE/BANDAGES/DRESSINGS) IMPLANT
BNDG ELASTIC 6X5.8 VLCR STR LF (GAUZE/BANDAGES/DRESSINGS) IMPLANT
BNDG GAUZE ELAST 4 BULKY (GAUZE/BANDAGES/DRESSINGS) ×4 IMPLANT
CANISTER SUCT 1200ML W/VALVE (MISCELLANEOUS) ×4 IMPLANT
COVER BACK TABLE 60X90IN (DRAPES) ×4 IMPLANT
COVER WAND RF STERILE (DRAPES) IMPLANT
DECANTER SPIKE VIAL GLASS SM (MISCELLANEOUS) IMPLANT
DERMABOND ADVANCED (GAUZE/BANDAGES/DRESSINGS)
DERMABOND ADVANCED .7 DNX12 (GAUZE/BANDAGES/DRESSINGS) IMPLANT
DRAPE INCISE IOBAN 66X45 STRL (DRAPES) IMPLANT
DRAPE U-SHAPE 76X120 STRL (DRAPES) ×4 IMPLANT
DRSG ADAPTIC 3X8 NADH LF (GAUZE/BANDAGES/DRESSINGS) IMPLANT
DRSG EMULSION OIL 3X3 NADH (GAUZE/BANDAGES/DRESSINGS) ×4 IMPLANT
DRSG HYDROCOLLOID 4X4 (GAUZE/BANDAGES/DRESSINGS) IMPLANT
DRSG PAD ABDOMINAL 8X10 ST (GAUZE/BANDAGES/DRESSINGS) IMPLANT
ELECT REM PT RETURN 9FT ADLT (ELECTROSURGICAL) ×4
ELECTRODE REM PT RTRN 9FT ADLT (ELECTROSURGICAL) ×2 IMPLANT
GAUZE SPONGE 4X4 12PLY STRL (GAUZE/BANDAGES/DRESSINGS) ×4 IMPLANT
GAUZE SPONGE 4X4 12PLY STRL LF (GAUZE/BANDAGES/DRESSINGS) ×4 IMPLANT
GLOVE BIO SURGEON STRL SZ 6.5 (GLOVE) ×12 IMPLANT
GLOVE BIO SURGEONS STRL SZ 6.5 (GLOVE) ×4
GOWN STRL REUS W/ TWL LRG LVL3 (GOWN DISPOSABLE) ×4 IMPLANT
GOWN STRL REUS W/TWL LRG LVL3 (GOWN DISPOSABLE) ×4
MATRIX WOUND 3-LAYER 7X10 (Tissue) ×3 IMPLANT
MICROMATRIX 1000MG (Tissue) ×4 IMPLANT
NEEDLE HYPO 25X1 1.5 SAFETY (NEEDLE) IMPLANT
NS IRRIG 1000ML POUR BTL (IV SOLUTION) ×4 IMPLANT
PACK BASIN DAY SURGERY FS (CUSTOM PROCEDURE TRAY) ×4 IMPLANT
PADDING CAST ABS 3INX4YD NS (CAST SUPPLIES)
PADDING CAST ABS 4INX4YD NS (CAST SUPPLIES)
PADDING CAST ABS COTTON 3X4 (CAST SUPPLIES) IMPLANT
PADDING CAST ABS COTTON 4X4 ST (CAST SUPPLIES) IMPLANT
PENCIL SMOKE EVACUATOR (MISCELLANEOUS) IMPLANT
SHEET MEDIUM DRAPE 40X70 STRL (DRAPES) ×4 IMPLANT
SLEEVE SCD COMPRESS KNEE MED (MISCELLANEOUS) IMPLANT
SOLUTION PARTIC MCRMTRX 1000MG (Tissue) ×2 IMPLANT
SPLINT FIBERGLASS 3X35 (CAST SUPPLIES) ×4 IMPLANT
SPLINT FIBERGLASS 4X30 (CAST SUPPLIES) IMPLANT
SPONGE LAP 18X18 RF (DISPOSABLE) ×4 IMPLANT
STAPLER VISISTAT 35W (STAPLE) IMPLANT
STOCKINETTE IMPERVIOUS LG (DRAPES) IMPLANT
SURGILUBE 2OZ TUBE FLIPTOP (MISCELLANEOUS) ×4 IMPLANT
SUT SILK 3 0 PS 1 (SUTURE) IMPLANT
SUT VIC AB 5-0 PS2 18 (SUTURE) ×8 IMPLANT
SYR BULB IRRIGATION 50ML (SYRINGE) ×4 IMPLANT
SYR CONTROL 10ML LL (SYRINGE) IMPLANT
TOWEL GREEN STERILE FF (TOWEL DISPOSABLE) ×4 IMPLANT
TRAY DSU PREP LF (CUSTOM PROCEDURE TRAY) ×4 IMPLANT
TUBE CONNECTING 20'X1/4 (TUBING) ×1
TUBE CONNECTING 20X1/4 (TUBING) ×3 IMPLANT
UNDERPAD 30X36 HEAVY ABSORB (UNDERPADS AND DIAPERS) ×4 IMPLANT
WOUND MATRIX 3-LAYER 7X10 (Tissue) ×1 IMPLANT
YANKAUER SUCT BULB TIP NO VENT (SUCTIONS) ×4 IMPLANT

## 2019-04-15 NOTE — Anesthesia Preprocedure Evaluation (Addendum)
Anesthesia Evaluation  Patient identified by MRN, date of birth, ID band Patient awake    Reviewed: Allergy & Precautions, NPO status , Patient's Chart, lab work & pertinent test results  History of Anesthesia Complications Negative for: history of anesthetic complications  Airway Mallampati: I  TM Distance: >3 FB Neck ROM: Full    Dental no notable dental hx.    Pulmonary neg pulmonary ROS,    Pulmonary exam normal        Cardiovascular negative cardio ROS Normal cardiovascular exam     Neuro/Psych negative neurological ROS  negative psych ROS   GI/Hepatic negative GI ROS, Neg liver ROS,   Endo/Other  negative endocrine ROS  Renal/GU negative Renal ROS  negative genitourinary   Musculoskeletal negative musculoskeletal ROS (+)   Abdominal   Peds  Hematology negative hematology ROS (+)   Anesthesia Other Findings Mohs Defect Of Left Scalp  Reproductive/Obstetrics negative OB ROS                            Anesthesia Physical Anesthesia Plan  ASA: I  Anesthesia Plan: General   Post-op Pain Management:    Induction: Intravenous  PONV Risk Score and Plan: 3 and Treatment may vary due to age or medical condition, Ondansetron, Dexamethasone and Midazolam  Airway Management Planned: LMA  Additional Equipment: None  Intra-op Plan:   Post-operative Plan: Extubation in OR  Informed Consent: I have reviewed the patients History and Physical, chart, labs and discussed the procedure including the risks, benefits and alternatives for the proposed anesthesia with the patient or authorized representative who has indicated his/her understanding and acceptance.     Dental advisory given  Plan Discussed with: CRNA  Anesthesia Plan Comments:        Anesthesia Quick Evaluation

## 2019-04-15 NOTE — Interval H&P Note (Signed)
History and Physical Interval Note:  04/15/2019 7:13 AM  Tony Cook  has presented today for surgery, with the diagnosis of Mohs Defect Of Left Scalp.  The various methods of treatment have been discussed with the patient and family. After consideration of risks, benefits and other options for treatment, the patient has consented to  Procedure(s): Placement of ACell to scalp wound (N/A) as a surgical intervention.  The patient's history has been reviewed, patient examined, no change in status, stable for surgery.  I have reviewed the patient's chart and labs.  Questions were answered to the patient's satisfaction.     Alena Bills Obdulio Mash

## 2019-04-15 NOTE — Anesthesia Postprocedure Evaluation (Signed)
Anesthesia Post Note  Patient: Tony Cook  Procedure(s) Performed: Placement of ACell to scalp wound (N/A Scalp)     Patient location during evaluation: PACU Anesthesia Type: General Level of consciousness: awake and alert and oriented Pain management: pain level controlled Vital Signs Assessment: post-procedure vital signs reviewed and stable Respiratory status: spontaneous breathing, nonlabored ventilation and respiratory function stable Cardiovascular status: blood pressure returned to baseline Postop Assessment: no apparent nausea or vomiting Anesthetic complications: no    Last Vitals:  Vitals:   04/15/19 0845 04/15/19 0900  BP: (!) 144/82 140/83  Pulse: (!) 52 (!) 54  Resp: (!) 8 (!) 8  Temp:    SpO2: 100% 98%    Last Pain:  Vitals:   04/15/19 0900  TempSrc:   PainSc: 2                  Kaylyn Layer

## 2019-04-15 NOTE — Discharge Instructions (Signed)
Wound Care with Acell ° °Guide to Wound Care  °Proper wound care may reduce the risk of infection, improve healing rates, and limit scarring.  This is a general guide to help care for and manage wounds treated with ACell MicroMatrix?or Cytal® Wound Matrix.  ° °Dressing Changes °The frequency of dressing changes can vary based on which product was applied, the size of the wound, or the amount of wound drainage. Dressing inspections are recommended, at least weekly.   °If you have a Wound VAC it will be changed in one week after the first time it is applied.  Then it will be changed once or twice a week.   °If you don't have a Wound VAC, then place KY gel on the wound daily and cover with gauze. ° °Dressing Types °Primary Dressing:  Non-adherent dressing goes directly over wounds being treated with the powder or sheet (MicroMatrix and/or Cytal).  °Secondary Dressing:  Secures the primary dressing in place and provides extra protection, compression, and absorption. ° °1. Wash Hands - To help decrease the risk of infection, caregivers should wash their hands for a minimum of 20 seconds and may use medical gloves.  ° °2. Remove the Dressings - Avoid removing product from the wound by carefully removing the applicable dressing(s) at the time points recommended above, or as recommended by the treating physician.  °Expected Color and Odor:  It is entirely normal for the wound to have an unpleasant odor and to form a caramel-colored gel as the product absorbs into the wound. It is  °important to leave this gel on the wound site. ° °3. Clean the Wound - Use clean water or saline to gently rinse around the wound surface and remove any excess discharge that may be present on the wound. Do not wipe off any of the caramel-colored gel on the wound.  ° °What to look out for: °• Large or increased amount of drainage  °• Surrounding skin has worsening redness or hot to touch  °• Increased pain in or around the wound  °• Flu-like  symptoms, fatigue, decreased appetite, fever  °• Hard, crusty wound surface with black or brown coloring ° °4. Apply New Dressings - Dressings should cover the entire wound and be suitable for maintaining a moist wound environment.  The non-adherent mesh dressing should be left in place.  New dressing should consist of KY Jelly to keep the wound moist and soft gauze secured with a wrap or tape.  ° °Maintain a Hydrated Wound Area °It is important to keep the wound area moist throughout the healing process. If the wound appears to be dry during dressing changes, select a dressing that will hydrate the wound and maintain that ideal moist environment. If you are unsure what to do, ask the treating physician. ° °Remodeling Process °Every patient heals differently, and no two cases are the same. The size and location of the wound, product type and layering configurations, and general patient health all contribute to how quickly a wound will heal.  While many factors can influence the rate at which the product absorbs, the following can be used as a general guide.  ° °THINGS TO DO: °Refrain from smoking °High protein diet with plenty of vegetables and some fruit  °Limit simple processed carbohydrates and sugar °Protect the wound from trauma °Protect the dressing ° °Micromatrix powder       Cytal Sheet            Sorbact dressing ° ° °  OXYCODONE GIVEN AT 9:40. NO PAIN MED UNTIL 3:40     Post Anesthesia Home Care Instructions  Activity: Get plenty of rest for the remainder of the day. A responsible individual must stay with you for 24 hours following the procedure.  For the next 24 hours, DO NOT: -Drive a car -Advertising copywriter -Drink alcoholic beverages -Take any medication unless instructed by your physician -Make any legal decisions or sign important papers.  Meals: Start with liquid foods such as gelatin or soup. Progress to regular foods as tolerated. Avoid greasy, spicy, heavy foods. If nausea and/or  vomiting occur, drink only clear liquids until the nausea and/or vomiting subsides. Call your physician if vomiting continues.  Special Instructions/Symptoms: Your throat may feel dry or sore from the anesthesia or the breathing tube placed in your throat during surgery. If this causes discomfort, gargle with warm salt water. The discomfort should disappear within 24 hours.  If you had a scopolamine patch placed behind your ear for the management of post- operative nausea and/or vomiting:  1. The medication in the patch is effective for 72 hours, after which it should be removed.  Wrap patch in a tissue and discard in the trash. Wash hands thoroughly with soap and water. 2. You may remove the patch earlier than 72 hours if you experience unpleasant side effects which may include dry mouth, dizziness or visual disturbances. 3. Avoid touching the patch. Wash your hands with soap and water after contact with the patch.

## 2019-04-15 NOTE — Transfer of Care (Signed)
Immediate Anesthesia Transfer of Care Note  Patient: Tony Cook  Procedure(s) Performed: Placement of ACell to scalp wound (N/A Scalp)  Patient Location: PACU  Anesthesia Type:General  Level of Consciousness: awake, alert  and oriented  Airway & Oxygen Therapy: Patient Spontanous Breathing and Patient connected to face mask oxygen  Post-op Assessment: Report given to RN and Post -op Vital signs reviewed and stable  Post vital signs: Reviewed and stable  Last Vitals:  Vitals Value Taken Time  BP 148/87 04/15/19 0816  Temp    Pulse 59 04/15/19 0817  Resp 11 04/15/19 0817  SpO2 100 % 04/15/19 0817  Vitals shown include unvalidated device data.  Last Pain:  Vitals:   04/15/19 0653  TempSrc: [place]  PainSc: 3          Complications: No apparent anesthesia complications

## 2019-04-15 NOTE — Op Note (Signed)
DATE OF OPERATION: 04/15/2019  LOCATION: [facility]  PREOPERATIVE DIAGNOSIS: scalp wound after cancer resection  POSTOPERATIVE DIAGNOSIS: Same  PROCEDURE: Preparation of scalp wound 5 x 6 x 1 cm for placement of Acell 7 x 10 cm and 1 gm powder  SURGEON: Semaj Kham [employer], DO  ASSISTANT: Joni Fears, PA  EBL: 5 cc  CONDITION: Stable  COMPLICATIONS: None  INDICATION: The patient, Tony Cook, is a 54 y.o. male born on Jan 12, 1966, is here for treatment of a large scalp wound.  He had a goodie powder this week and last night so may be prone to bleeding.  PROCEDURE DETAILS:  The patient was seen prior to surgery and marked.  The IV antibiotics were given. The patient was taken to the operating room and given a general anesthetic. A standard time out was performed and all information was confirmed by those in the room. SCDs were placed.   The scalp was prepped and draped.  The scalp was irrigated with antibiotic solution and saline.  All of the Acell powder and sheet was applied.  The sheet was secured in place with 5-0 Vicryl.  The adaptic was applied and secured as well.  KY gel was applied and a sterile dressing.  The patient was allowed to wake up and taken to recovery room in stable condition at the end of the case. The family was notified at the end of the case.   The advanced practice practitioner (APP) assisted throughout the case.  The APP was essential in retraction and counter traction when needed to make the case progress smoothly.  This retraction and assistance made it possible to see the tissue plans for the procedure.  The assistance was needed for blood control, tissue re-approximation and assisted with closure of the incision site.  The 21st Century Cures Act was signed into law in 2016 which includes the topic of electronic health records.  This provides immediate access to information in MyChart.  This includes consultation notes, operative notes,  office notes, lab results and pathology reports.  If you have any questions about what you read please let us know at your next visit or call us at the office.  We are right here with you.

## 2019-04-15 NOTE — Anesthesia Procedure Notes (Signed)
Procedure Name: LMA Insertion Date/Time: 04/15/2019 7:34 AM Performed by: [place], Milus Fritze C, CRNA Pre-anesthesia Checklist: Patient identified, Emergency Drugs available, Suction available and Patient being monitored Patient Re-evaluated:Patient Re-evaluated prior to induction Oxygen Delivery Method: Circle system utilized Preoxygenation: Pre-oxygenation with 100% oxygen Induction Type: IV induction Ventilation: Mask ventilation without difficulty LMA: LMA inserted LMA Size: 5.0 Number of attempts: 1 Airway Equipment and Method: Bite block Placement Confirmation: positive ETCO2 Tube secured with: Tape Dental Injury: Teeth and Oropharynx as per pre-operative assessment

## 2019-04-16 ENCOUNTER — Telehealth: Payer: Self-pay

## 2019-04-16 NOTE — Telephone Encounter (Signed)
Call from pt. He is calling to ask about his dressing/wound care I instructed him to leave all mesh clear/or green in place-( I informed him that this layer is sutured in )- & that this is going to allow the Acell to stay in the wound bed & incorporate He will apply KY gel to the Adaptic dressing &  He can change the outer gauze dressing & bandage/hat - He understands to keep the area dry until he returns for his f/u with Dr. Ulice Bold on 04/30/19 He understands the plan of care & will call for any changes or concerns

## 2019-04-17 ENCOUNTER — Encounter: Payer: Self-pay | Admitting: *Deleted

## 2019-04-22 ENCOUNTER — Encounter: Payer: Self-pay | Admitting: *Deleted

## 2019-04-29 NOTE — Progress Notes (Signed)
   Subjective:     Patient ID: Tony Cook, male    DOB: 1965/09/06, 54 y.o.   MRN: [id number]  Chief Complaint  Patient presents with  . Skin Problem    HPI: The patient is a 54 y.o. male here for follow-up after preparation of scalp wound 5 x 6 x 1 cm for placement of ACell 7 x 10 cm and 1 g ACell powder on 04/15/2019 with Dr. Ulice Bold.  Patient reports overall he's doing well.  Scalp pain well controlled with medication and occasional headache.  Denies chest pain, fever, nausea/vomiting. BM+   Reports he has been applying KY Jelly 2-3 times a day to scalp wound. He is also working a lot.  His daughter's wedding is in October, so he hopes to have his head well healed by then.  Review of Systems  Constitutional: Negative for chills and fever.  HENT: Negative for congestion, sneezing and sore throat.   Eyes: Negative for visual disturbance.  Respiratory: Negative for cough and shortness of breath.   Cardiovascular: Negative for chest pain and palpitations.  Gastrointestinal: Negative for abdominal pain, nausea and vomiting.  Musculoskeletal: Negative for back pain, myalgias and neck pain.  Skin: Positive for wound. Negative for rash.       Pain around scalp wound well controlled.  Neurological: Positive for headaches.     Objective:   Vital Signs BP 135/79 (BP Location: Left Arm, Patient Position: Sitting, Cuff Size: Normal)   Pulse 71   Temp 98 F (36.7 C) (Temporal)   Ht 5\' 8"  (1.727 m)   Wt 163 lb (73.9 kg)   SpO2 94%   BMI 24.78 kg/m  Vital Signs and Nursing Note Reviewed  Physical Exam  Constitutional: He is oriented to person, place, and time and well-developed, well-nourished, and in no distress.  HENT:  Head: Normocephalic and atraumatic.    Mohs defect. ACell is black in color. Good granulation tissue visible. Peri-wound mild redness. No signs of infection.  Eyes: EOM are normal.  Pulmonary/Chest: Effort normal.  Musculoskeletal:        General:  Normal range of motion.     Cervical back: Normal range of motion.  Neurological: He is alert and oriented to person, place, and time. Gait normal.  Skin: Skin is warm and dry. No rash noted. No erythema. No pallor.  Psychiatric: Mood, memory, affect and judgment normal.      Assessment/Plan:     ICD-10-CM   1. 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 is doing well overall today.  Large portion of Acell is black in color, suspect dryness. Good granulation tissue visible.   Apply KY jelly to wound 2-3 times daily to keep it well moisturized. Cover with Gauze and secure.  Follow up bi weekly for next month. Call office with any questions/concerns.  The 21st Century Cures Act was signed into law in 2016 which includes the topic of electronic health records.  This provides immediate access to information in MyChart.  This includes consultation notes, operative notes, office notes, lab results and pathology reports.  If you have any questions about what you read please let 2017 know at your next visit or call us at the office.  We are right here with you.   [place], PA-C 04/30/2019, 1:53 PM

## 2019-04-30 ENCOUNTER — Encounter: Payer: BC Managed Care – PPO | Admitting: Plastic Surgery

## 2019-04-30 ENCOUNTER — Other Ambulatory Visit: Payer: Self-pay

## 2019-04-30 ENCOUNTER — Encounter: Payer: Self-pay | Admitting: Plastic Surgery

## 2019-04-30 ENCOUNTER — Ambulatory Visit (INDEPENDENT_AMBULATORY_CARE_PROVIDER_SITE_OTHER): Payer: BC Managed Care – PPO | Admitting: Plastic Surgery

## 2019-04-30 VITALS — BP 135/79 | HR 71 | Temp 98.0°F | Ht 68.0 in | Wt 163.0 lb

## 2019-04-30 DIAGNOSIS — M952 Other acquired deformity of head: Secondary | ICD-10-CM

## 2019-04-30 DIAGNOSIS — Z9889 Other specified postprocedural states: Secondary | ICD-10-CM

## 2019-05-06 ENCOUNTER — Telehealth: Payer: Self-pay

## 2019-05-06 NOTE — Telephone Encounter (Signed)
Called patient, no answer. Did not leave VM.   Recommend continuing with daily dressing changes as instructed, some of the Acell may come off with changes due to bandages drying out. If they seem to be drying out quick, apply more KY to keep moist and prevent the Acell from becoming dehydrated.   Call with any further questions or concerns, f/u scheduled for 05/14/19

## 2019-05-06 NOTE — Telephone Encounter (Signed)
Patient called to report that he feels the Acell is coming off. He has noticed this when changing his bandages and would like to know if this normal or not.

## 2019-05-14 ENCOUNTER — Other Ambulatory Visit: Payer: Self-pay

## 2019-05-14 ENCOUNTER — Encounter: Payer: Self-pay | Admitting: Plastic Surgery

## 2019-05-14 ENCOUNTER — Ambulatory Visit (INDEPENDENT_AMBULATORY_CARE_PROVIDER_SITE_OTHER): Payer: BC Managed Care – PPO | Admitting: Plastic Surgery

## 2019-05-14 VITALS — BP 158/99 | HR 69 | Temp 98.6°F | Ht 68.0 in | Wt 170.4 lb

## 2019-05-14 DIAGNOSIS — M952 Other acquired deformity of head: Secondary | ICD-10-CM

## 2019-05-14 NOTE — Progress Notes (Signed)
   Subjective:    Patient ID: Tony Cook, male    DOB: [birth year], 54 y.o.   MRN: [id number]  The patient is a 54 year old male here for follow up on his scalp wound.  He has some signs of granulation tissue.  No sign of infection. The periwound area is less irritated.     Review of Systems  Constitutional: Negative.   HENT: Negative.   Eyes: Negative.   Respiratory: Negative.   Cardiovascular: Negative.   Gastrointestinal: Negative.   Genitourinary: Negative.        Objective:   Physical Exam Vitals and nursing note reviewed.  Constitutional:      Appearance: Normal appearance.  HENT:     Head: Normocephalic.   Cardiovascular:     Rate and Rhythm: Normal rate.     Pulses: Normal pulses.  Pulmonary:     Effort: Pulmonary effort is normal.  Neurological:     General: No focal deficit present.     Mental Status: He is alert and oriented to person, place, and time.  Psychiatric:        Mood and Affect: Mood normal.        Behavior: Behavior normal.       Assessment & Plan:     ICD-10-CM   1. 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  Keep KY on twice daily.   Follow up in 2 weeks.   Pictures were obtained of the patient and placed in the chart with the patient's or guardian's permission.

## 2019-05-27 NOTE — Progress Notes (Signed)
Patient is a 54 year old male here for follow-up on his scalp wound.  He underwent preparation of scalp wound 5 x 6 x 1 cm for placement of ACell 7 x 10 cm and 1 g ACell powder on 04/15/2019 with Dr. Ulice Bold. Applied donated Acell on 05/14/19.  Patient reports feeling well overall. Occasional headaches.  Have not increased in frequency. Scalp wound is continuing to form good granulation tissue. Appears no acell remains unincorporated. No signs of infection. Added donated ACell.  Wound currently 5 x 6 x 1 cm with granulation tissue forming.   Continue applying KY Jelly 2-3 times daily, cover with nonstick gauze. Continue diet of high protein, high vegetables, low sugar, low carbs. Continue daily multivitamin, vitamin C. Start taking Zinc twice daily (RX sent to pharmacy). Wait 5 days before getting the wound wet.   Follow up in 2 weeks for wound check. Call office with any questions/concerns.  Pictures were obtained of the patient and placed in the chart with the patient's or guardian's permission.  Daughter's wedding in Oct.  The 21st Century Cures Act was signed into law in 2016 which includes the topic of electronic health records.  This provides immediate access to information in MyChart.  This includes consultation notes, operative notes, office notes, lab results and pathology reports.  If you have any questions about what you read please let us know at your next visit or call us at the office.  We are right here with you.

## 2019-05-28 ENCOUNTER — Ambulatory Visit (INDEPENDENT_AMBULATORY_CARE_PROVIDER_SITE_OTHER): Payer: BC Managed Care – PPO | Admitting: Plastic Surgery

## 2019-05-28 ENCOUNTER — Encounter: Payer: Self-pay | Admitting: Plastic Surgery

## 2019-05-28 ENCOUNTER — Other Ambulatory Visit: Payer: Self-pay

## 2019-05-28 VITALS — BP 166/103 | HR 60 | Temp 97.7°F | Wt 174.0 lb

## 2019-05-28 DIAGNOSIS — M952 Other acquired deformity of head: Secondary | ICD-10-CM

## 2019-05-28 MED ORDER — ZINC ACETATE 50 MG PO CAPS: 1.0000 | ORAL_CAPSULE | Freq: Two times a day (BID) | ORAL | 2 refills | Status: DC

## 2019-05-29 ENCOUNTER — Other Ambulatory Visit (INDEPENDENT_AMBULATORY_CARE_PROVIDER_SITE_OTHER): Payer: BC Managed Care – PPO | Admitting: Plastic Surgery

## 2019-05-29 DIAGNOSIS — M952 Other acquired deformity of head: Secondary | ICD-10-CM

## 2019-05-29 MED ORDER — ZINC GLUCONATE 50 MG PO TABS: 50.0000 mg | ORAL_TABLET | Freq: Two times a day (BID) | ORAL | 2 refills | Status: DC

## 2019-05-29 NOTE — Progress Notes (Signed)
Order sent for Zinc gluconate per patient request. Insurance didn't cover Zinc acetate.

## 2019-05-30 ENCOUNTER — Telehealth: Payer: Self-pay | Admitting: *Deleted

## 2019-05-30 NOTE — Telephone Encounter (Signed)
Faxed order on (05/29/19) via of fax to [facility] for supplies for the patient.  Confirmation received.//AB/CMA

## 2019-06-03 NOTE — Telephone Encounter (Signed)
Received order status notification from [facility] on (05/30/19) stating they have provided service for the patient; no furture action is required.//AB/CMA

## 2019-06-10 NOTE — Progress Notes (Signed)
Patient is a 54 year old male here for follow-up of his scalp wound.  He underwent preparation of scalp wound 5 x 6 x 1 cm for placement of ACell 7 x 10 cm and 1 g ACell powder on 04/15/2019 with Dr. Ulice Bold.  Reports he is doing well today. Denies HA, CP, SOB, N/V. Granulation tissue continues to slowing form. Applied donated ACell   Continue applying K-Y jelly 2-3 times daily, cover with nonstick gauze.  Continue diet of high protein, high vegetables, low sugar, low carbs.  Continue daily multivitamin, vitamin C, and zinc (twice daily). Do not allow water to wash over the area for 5-7 days to allow Acell powder to incorporate.    Pictures were obtained of the patient and placed in the chart with the patient's or guardian's permission.  Follow up in 2-3 weeks. Call office with any questions/concerns.  The 21st Century Cures Act was signed into law in 2016 which includes the topic of electronic health records.  This provides immediate access to information in MyChart.  This includes consultation notes, operative notes, office notes, lab results and pathology reports.  If you have any questions about what you read please let us know at your next visit or call us at the office.  We are right here with you.

## 2019-06-11 ENCOUNTER — Other Ambulatory Visit: Payer: Self-pay

## 2019-06-11 ENCOUNTER — Ambulatory Visit (INDEPENDENT_AMBULATORY_CARE_PROVIDER_SITE_OTHER): Payer: BC Managed Care – PPO | Admitting: Plastic Surgery

## 2019-06-11 ENCOUNTER — Encounter: Payer: Self-pay | Admitting: Plastic Surgery

## 2019-06-11 VITALS — BP 139/89 | HR 67 | Temp 98.2°F | Ht 68.0 in | Wt 174.0 lb

## 2019-06-11 DIAGNOSIS — M952 Other acquired deformity of head: Secondary | ICD-10-CM

## 2019-07-02 ENCOUNTER — Other Ambulatory Visit: Payer: Self-pay

## 2019-07-02 ENCOUNTER — Ambulatory Visit (INDEPENDENT_AMBULATORY_CARE_PROVIDER_SITE_OTHER): Payer: BC Managed Care – PPO | Admitting: Surgical

## 2019-07-02 ENCOUNTER — Encounter: Payer: Self-pay | Admitting: Surgical

## 2019-07-02 VITALS — BP 140/65 | HR 68 | Temp 97.7°F | Ht 68.0 in | Wt 173.0 lb

## 2019-07-02 DIAGNOSIS — M952 Other acquired deformity of head: Secondary | ICD-10-CM

## 2019-07-02 NOTE — Progress Notes (Signed)
   Subjective:     Patient ID: Tony Cook, male    DOB: [birth year], 54 y.o.   MRN: [id number]  Chief Complaint  Patient presents with  . Follow-up    HPI: The patient is a 54 y.o. male here for follow-up after application of ACell to scalp wound on 04/15/2019 by Dr. Ulice Bold.  Patient previously underwent debridement of Mohs defect of scalp with placement of ACell on 03/13/2019.  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  He had a significant bleed large area of exposed bone prior to debridement and placement of ACell on 03/13/2019.  Due to his radiation damage, exposed bone he has had a hard time forming granulation tissue.  Today he reports that he is doing well.  He has been applying K-Y jelly 2-3 times per day to help keep the wound moist.  He has not had any fevers, chills, nausea, vomiting.  He reports that the periwound area is itchy, but is otherwise doing well.  Review of Systems  Constitutional: Positive for activity change. Negative for chills and fever.  Respiratory: Negative.   Cardiovascular: Negative.   Musculoskeletal: Negative.   Skin: Positive for color change and wound.     Objective:   Vital Signs BP 140/65 (BP Location: Left Arm, Patient Position: Sitting, Cuff Size: Normal)   Pulse 68   Temp 97.7 F (36.5 C) (Temporal)   Ht 5\' 8"  (1.727 m)   Wt 173 lb (78.5 kg)   SpO2 99%   BMI 26.30 kg/m  Vital Signs and Nursing Note Reviewed  Physical Exam  Constitutional: He is oriented to person, place, and time and well-developed, well-nourished, and in no distress. No distress.  HENT:  Head:    Patient even has some epithelial tissue noted at the medial border.  Exposed bone is desiccate.   Pulmonary/Chest: Effort normal.  Neurological: He is alert and oriented to person, place, and time. Gait normal.  Skin: Skin  is warm and dry. He is not diaphoretic.  Psychiatric: Mood and affect normal.      Assessment/Plan:     ICD-10-CM   1. 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  He has been applying K-Y jelly 2-3 times daily to increase moisture to exposed bone.  This appears to have been helpful and he has some new granulation tissue noted.  He does have epithelialization noted medially.  There is no sign of any infection.  He does have periwound erythema, which appears to be associated with his radiation damaged skin.  Dr. Ashley Royalty present on today's evaluation.  Donated ACell applied to left forehead wound, recommend applying K-Y jelly daily for the next 5 days then transitioning to applying K-Y jelly 2-3 times per day to increase moisture.  He can shower after 5 days.  Planning to discuss this case further with Dr. Ulice Bold for evaluation of possible scalp flap.  Patient at this time would like to continue with local wound care but is open to surgical intervention in the future.  Pictures were obtained of the patient and placed in the chart with the patient's or guardian's permission.  Call with questions or concerns.   Arita Miss Alena Blankenbeckler, PA-C 07/02/2019, 4:22 PM

## 2019-07-23 ENCOUNTER — Encounter: Payer: Self-pay | Admitting: Surgical

## 2019-07-23 ENCOUNTER — Other Ambulatory Visit: Payer: Self-pay

## 2019-07-23 ENCOUNTER — Ambulatory Visit (INDEPENDENT_AMBULATORY_CARE_PROVIDER_SITE_OTHER): Payer: BC Managed Care – PPO | Admitting: Surgical

## 2019-07-23 VITALS — BP 133/84 | HR 58 | Temp 98.4°F

## 2019-07-23 DIAGNOSIS — M952 Other acquired deformity of head: Secondary | ICD-10-CM

## 2019-07-23 NOTE — Progress Notes (Signed)
   Subjective:     Patient ID: Tony Cook, male    DOB: 07/05/1965, 54 y.o.   MRN: [id number]  Chief Complaint  Patient presents with  . Follow-up    HPI: The patient is a 54 y.o. male here for follow-up on his scalp wound.  He has history of radiation to this area due to squamous cell carcinoma.  He reports he has been doing well overall, he has been doing daily dressing changes with K-Y jelly, sometimes 3 times a day to assist with moisturization.  He reports some swelling of his left eye lid area.  This is improved today.  He has been applying Vaseline at the periwound area due to irritation/itching.  Review of Systems  Constitutional: Negative.   Gastrointestinal: Negative.   Musculoskeletal: Negative.   Skin: Positive for color change and wound.     Objective:   Vital Signs BP 133/84 (BP Location: Left Arm, Patient Position: Sitting, Cuff Size: Normal)   Pulse (!) 58   Temp 98.4 F (36.9 C)   SpO2 98%  Vital Signs and Nursing Note Reviewed Chaperone present Physical Exam  Constitutional: He is oriented to person, place, and time and well-developed, well-nourished, and in no distress.  HENT:  Head:    New granulation tissue noted within wound bed of the left anterior scalp wound.  There is continued periwound erythema, this is associated with radiation damaged tissue.  There is no foul odor.  There is no purulence.  Pulmonary/Chest: Effort normal.  Neurological: He is alert and oriented to person, place, and time. Gait normal.  Skin: He is not diaphoretic.   His scalp wound is approximately 5 x 6 x 1 cm new granulation tissue noted within wound bed   Assessment/Plan:     ICD-10-CM   1. 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  Cover this with Adaptic.  Recommend applying K-Y jelly daily for the next few days until powder has completely incorporated.  We will order  cellerate for patient to receive in the mail.  He can apply this daily followed by Adaptic and 4 x 4 gauze.  Overall he is doing really well, he has incorporated a good amount of granulation tissue in the past few weeks and we are hopeful that he will heal this on his own without any further need for surgical intervention or scalp flap.  Patient is very pleased with his progress.  Follow-up in 2 weeks.  Kermit Balo Larae Caison, PA-C 07/23/2019, 3:29 PM

## 2019-07-24 ENCOUNTER — Telehealth: Payer: Self-pay

## 2019-07-24 NOTE — Telephone Encounter (Signed)
Confirmation from [facility] that pt is approved & supplies will be shipped to pt Copy scanned into chart

## 2019-08-06 ENCOUNTER — Other Ambulatory Visit: Payer: Self-pay

## 2019-08-06 ENCOUNTER — Encounter: Payer: Self-pay | Admitting: Surgical

## 2019-08-06 ENCOUNTER — Ambulatory Visit (INDEPENDENT_AMBULATORY_CARE_PROVIDER_SITE_OTHER): Payer: BC Managed Care – PPO | Admitting: Surgical

## 2019-08-06 VITALS — BP 153/86 | HR 68 | Temp 98.4°F | Ht 68.0 in | Wt 171.8 lb

## 2019-08-06 DIAGNOSIS — M952 Other acquired deformity of head: Secondary | ICD-10-CM

## 2019-08-06 NOTE — Progress Notes (Signed)
   Subjective:     Patient ID: Tony Cook, male    DOB: [birth year], 54 y.o.   MRN: [id number]  Chief Complaint  Patient presents with  . Follow-up    2 weeks for Moh's defect of (L) scalp    HPI: The patient is a 54 y.o. male here for follow-up on his scalp wound.  At his last visit we had started him on cellerate activated collagen to assist with granulation tissue.  He reports this has been going well.  He has been doing daily dressing changes in the mornings.  He has been taking great care of the wound and is doing a very good job at dressing changes.  He continues to apply Vaseline to the periwound area due to irritation and itching.  Review of Systems  Constitutional: Negative.   Skin: Positive for color change and wound. Negative for rash.     Objective:   Vital Signs BP (!) 153/86 (BP Location: Left Arm, Patient Position: Sitting, Cuff Size: Large)   Pulse 68   Temp 98.4 F (36.9 C) (Temporal)   Ht 5\' 8"  (1.727 m)   Wt 171 lb 12.8 oz (77.9 kg)   SpO2 100%   BMI 26.12 kg/m  Vital Signs and Nursing Note Reviewed Physical Exam  Constitutional: He is well-developed, well-nourished, and in no distress.  HENT:  Head:    Musculoskeletal:        General: Normal range of motion.  Neurological: He is alert.  Skin: Skin is warm and dry. There is erythema (Periwound, radiation-induced).  Psychiatric: Mood and affect normal.      Assessment/Plan:     ICD-10-CM   1. 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  He denies some epithelialization noted at 12:00 mark of the wound.  He continues to have periwound erythema, this is associated with radiation damage.  There is no sign of any infection.   I applied donated ACell today to the wound, recommend applying K-Y jelly daily to the Adaptic.  Change  Adaptic in 3 days or sooner pending soil level.  Resume cellerate dressing changes daily after 4 days of ACell.  Call with any questions or concerns.  Follow-up in 2 weeks.  Tony Royalty Winnell Bento, PA-C 08/06/2019, 4:31 PM

## 2019-08-07 ENCOUNTER — Other Ambulatory Visit: Payer: Self-pay | Admitting: Surgical

## 2019-08-07 DIAGNOSIS — M952 Other acquired deformity of head: Secondary | ICD-10-CM

## 2019-08-07 MED ORDER — ZINC ACETATE 50 MG PO CAPS: 1.0000 | ORAL_CAPSULE | Freq: Two times a day (BID) | ORAL | 2 refills | Status: AC

## 2019-08-07 NOTE — Progress Notes (Signed)
Zinc ordered for patient

## 2019-08-20 ENCOUNTER — Encounter: Payer: Self-pay | Admitting: Surgical

## 2019-08-20 ENCOUNTER — Other Ambulatory Visit: Payer: Self-pay

## 2019-08-20 ENCOUNTER — Ambulatory Visit (INDEPENDENT_AMBULATORY_CARE_PROVIDER_SITE_OTHER): Payer: BC Managed Care – PPO | Admitting: Surgical

## 2019-08-20 VITALS — BP 145/90 | HR 99 | Temp 98.0°F

## 2019-08-20 DIAGNOSIS — M952 Other acquired deformity of head: Secondary | ICD-10-CM | POA: Diagnosis not present

## 2019-08-20 NOTE — Progress Notes (Signed)
   Subjective:     Patient ID: Tony Cook, male    DOB: [birth year], 54 y.o.   MRN: [id number]  Chief Complaint  Patient presents with  . Follow-up    HPI: The patient is a 54 y.o. male here for follow-up on his left forehead wound after Mohs and radiation.  He is doing well.  He has new granulation tissue and some new epithelialization.  No abnormal changes noted.   Review of Systems  Constitutional: Negative.   Skin: Positive for color change and wound.     Objective:   Vital Signs BP (!) 145/90 (BP Location: Left Arm, Patient Position: Sitting, Cuff Size: Normal)   Pulse 99   Temp 98 F (36.7 C) (Temporal)   SpO2 96%  Vital Signs and Nursing Note Reviewed Physical Exam  Constitutional: He is oriented to person, place, and time and well-developed, well-nourished, and in no distress.  HENT:  Head:    Neurological: He is alert and oriented to person, place, and time. Gait normal.  Skin: Skin is warm and dry. He is not diaphoretic. There is erythema (Slight periwound redness).  Psychiatric: Mood and affect normal.      Assessment/Plan:     ICD-10-CM   1. 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  After 3 days of ACell being in place begin using activated collagen powder/ointment.  Patient reports that he is running out of Adaptic mesh and the collagen powder.  We will reorder some.  Follow-up in 2 weeks for reevaluation.  He is doing really well, there is a lot of new granular growth and some epithelialization noted.  The periwound erythema has significantly improved.  No sign of any infection.  Call with questions or concerns  Pictures were obtained of the patient and placed in the chart with the patient's or guardian's permission.    Kermit Balo Marysa Wessner, PA-C 08/20/2019, 3:51 PM

## 2019-09-10 ENCOUNTER — Encounter: Payer: Self-pay | Admitting: Surgical

## 2019-09-10 ENCOUNTER — Other Ambulatory Visit: Payer: Self-pay

## 2019-09-10 ENCOUNTER — Ambulatory Visit (INDEPENDENT_AMBULATORY_CARE_PROVIDER_SITE_OTHER): Payer: BC Managed Care – PPO | Admitting: Surgical

## 2019-09-10 VITALS — BP 146/87 | HR 70 | Temp 97.3°F

## 2019-09-10 DIAGNOSIS — M952 Other acquired deformity of head: Secondary | ICD-10-CM | POA: Diagnosis not present

## 2019-09-10 DIAGNOSIS — Z9889 Other specified postprocedural states: Secondary | ICD-10-CM

## 2019-09-10 NOTE — Progress Notes (Signed)
   Subjective:     Patient ID: Tony Cook, male    DOB: 29-Jun-1965, 54 y.o.   MRN: [id number]  Chief Complaint  Patient presents with  . Follow-up    HPI: The patient is a 54 y.o. male here for follow-up on his scalp wound. He is doing well. He has some new epithelialization noted superiorly. No abnormal or concerning changes.  Review of Systems  Constitutional: Negative.   Cardiovascular: Negative.   Skin: Positive for color change and wound. Negative for rash.     Objective:   Vital Signs BP (!) 146/87 (BP Location: Left Arm, Patient Position: Sitting, Cuff Size: Large)   Pulse 70   Temp (!) 97.3 F (36.3 C) (Temporal)   SpO2 99%  Vital Signs and Nursing Note Reviewed Chaperone present Physical Exam Constitutional:      General: He is not in acute distress.    Appearance: Normal appearance. He is not ill-appearing.  HENT:     Head:   Pulmonary:     Effort: Pulmonary effort is normal.  Neurological:     General: No focal deficit present.     Mental Status: He is alert and oriented to person, place, and time. Mental status is at baseline.      Assessment/Plan:     ICD-10-CM   1. 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   Due to desiccating bone, recommend Vaseline dressing changes 2x daily followed by adaptic and gauze. Will order more supplies for patient via [facility].   Call with questions or concerns, follow up in 2 to 3 week for re-evaluation.  Pictures were obtained of the patient and placed in the chart with the patient's or guardian's permission.   Tony Balo Lathyn Griggs, PA-C 09/10/2019, 4:37 PM

## 2019-09-12 ENCOUNTER — Encounter: Payer: Self-pay | Admitting: Plastic Surgery

## 2019-10-03 ENCOUNTER — Ambulatory Visit (INDEPENDENT_AMBULATORY_CARE_PROVIDER_SITE_OTHER): Payer: BC Managed Care – PPO | Admitting: Surgical

## 2019-10-03 ENCOUNTER — Other Ambulatory Visit: Payer: Self-pay

## 2019-10-03 ENCOUNTER — Encounter: Payer: Self-pay | Admitting: Surgical

## 2019-10-03 VITALS — BP 145/90 | HR 66 | Temp 98.3°F

## 2019-10-03 DIAGNOSIS — M952 Other acquired deformity of head: Secondary | ICD-10-CM

## 2019-10-03 NOTE — Progress Notes (Signed)
° °  Subjective:     Patient ID: [name], male    DOB: August 21, 1965, 54 y.o.   MRN: [id number]  Chief Complaint  Patient presents with   Follow-up    HPI: The patient is a 54 y.o. male here for follow-up on his scalp wound after Mohs.  Patient also has a history of radiation to this area.  Overall Mr. Thayer is doing well, he reports that recently his left upper eyelid and eyebrow had been swollen and tender, this has mostly resolved.  He is unsure what caused this.  He believes he possibly was bitten by an insect.   Review of Systems  Constitutional: Negative.   Skin: Positive for color change and wound.     Objective:   Vital Signs BP (!) 145/90 (BP Location: Left Arm, Patient Position: Sitting, Cuff Size: Large)    Pulse 66    Temp 98.3 F (36.8 C) (Oral)    SpO2 100%  Vital Signs and Nursing Note Reviewed Physical Exam Constitutional:      General: He is not in acute distress.    Appearance: Normal appearance. He is not ill-appearing.  HENT:     Head:      Comments: Patient's wound is approximately 5 x 5 cm Skin:    General: Skin is warm.  Neurological:     General: No focal deficit present.     Mental Status: He is alert and oriented to person, place, and time. Mental status is at baseline.  Psychiatric:        Mood and Affect: Mood normal.        Behavior: Behavior normal.     Assessment/Plan:     ICD-10-CM   1. 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  Recommend changing Adaptic every 3 days or so depending on soil level.  Recommend applying K-Y jelly daily to Adaptic mesh and covering with a 4 x 4 gauze.  Recommend changing endoform dermal every 4 days or so pending the how quickly it incorporates. Recommend applying collagen gel to areas of exposed bone to help promote granulation.  Provided patient with instructions on how to change.  Patient reports he is out of  Adaptic and collagen gel, we will order some new supplies for patient.  Recommend calling with questions or concerns. Follow up scheduled for 2 weeks for reevaluation.   Kermit Balo Takuya Lariccia, PA-C 10/03/2019, 3:59 PM

## 2019-10-04 ENCOUNTER — Telehealth: Payer: Self-pay

## 2019-10-04 NOTE — Telephone Encounter (Signed)
Called [facility], spoke with Delaney Meigs. Ordered for Matt:  Adaptic-daily Cellerate-daily

## 2019-10-30 ENCOUNTER — Other Ambulatory Visit: Payer: Self-pay

## 2019-10-30 ENCOUNTER — Ambulatory Visit (INDEPENDENT_AMBULATORY_CARE_PROVIDER_SITE_OTHER): Payer: BC Managed Care – PPO | Admitting: Surgical

## 2019-10-30 ENCOUNTER — Encounter: Payer: Self-pay | Admitting: Surgical

## 2019-10-30 VITALS — BP 146/85 | HR 70 | Temp 98.5°F

## 2019-10-30 DIAGNOSIS — M952 Other acquired deformity of head: Secondary | ICD-10-CM

## 2019-10-30 DIAGNOSIS — Z9889 Other specified postprocedural states: Secondary | ICD-10-CM

## 2019-10-30 NOTE — Progress Notes (Signed)
   Subjective:     Patient ID: Tony Cook, male    DOB: 11-10-1965, 54 y.o.   MRN: [id number]  Chief Complaint  Patient presents with  . Follow-up    HPI: The patient is a 54 y.o. male here for follow-up on his scalp wound after Mohs and radiation. He has been applying endoform and cellerate powder to his wound. He is doing well today, no new complaints. He is here with his sister.  Review of Systems  Constitutional: Negative.   Skin: Positive for color change and wound.     Objective:   Vital Signs BP (!) 146/85 (BP Location: Left Arm, Patient Position: Sitting, Cuff Size: Large)   Pulse 70   Temp 98.5 F (36.9 C) (Oral)   SpO2 99%  Vital Signs and Nursing Note Reviewed  Physical Exam Constitutional:      General: He is not in acute distress.    Appearance: He is not ill-appearing or diaphoretic.  HENT:     Head:   Neurological:     General: No focal deficit present.     Mental Status: He is alert.  Psychiatric:        Mood and Affect: Mood normal.        Behavior: Behavior normal.       Assessment/Plan:     ICD-10-CM   1. 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 He has has some improvement, but it is slow. We have discussed the barriers to his healing process/journey.  Donated Acell applied to areas without granulation tissue. Apply KY jelly daily for 3-4 days, then shower and begin using collagen powder, endoform and adaptic to assist with promoting new granulation tissue and epithelialization.  No sign of infection. Recommend calling with questions or concerns. Follow up scheduled for 1 month with Dr. Ulice Bold.  Pictures were obtained of the patient and placed in the chart with the patient's or guardian's permission.  Kermit Balo Lenzi Marmo, PA-C 10/30/2019, 5:22 PM

## 2019-12-03 ENCOUNTER — Other Ambulatory Visit: Payer: Self-pay

## 2019-12-03 ENCOUNTER — Ambulatory Visit (INDEPENDENT_AMBULATORY_CARE_PROVIDER_SITE_OTHER): Payer: BC Managed Care – PPO | Admitting: Plastic Surgery

## 2019-12-03 ENCOUNTER — Encounter: Payer: Self-pay | Admitting: Plastic Surgery

## 2019-12-03 DIAGNOSIS — M952 Other acquired deformity of head: Secondary | ICD-10-CM

## 2019-12-03 NOTE — Progress Notes (Signed)
   Subjective:    Patient ID: Tony Cook, male    DOB: [birth year], 54 y.o.   MRN: [id number]  Patient is a 54 year old male here with his sister for evaluation of his scalp.  He is doing remarkably well.  He is showing signs of granulating and epithelializing of his scalp wound.  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  The entire area was about 5 x 5 cm with exposed bone.  He now has less than a centimeter of exposed bone.  The rest is either granulating or epithelializing.  The posterior aspect is epithelialized.  He is using celebrate which seems to be holding things steady for right now.  No sign of infection.     Review of Systems  Constitutional: Negative.   HENT: Negative.   Eyes: Negative.   Respiratory: Negative.   Cardiovascular: Negative.   Genitourinary: Negative.   Skin: Positive for color change and wound.  Hematological: Negative.        Objective:   Physical Exam Vitals and nursing note reviewed.  Constitutional:      Appearance: Normal appearance.  Cardiovascular:     Rate and Rhythm: Normal rate.     Pulses: Normal pulses.  Neurological:     Mental Status: He is alert. Mental status is at baseline.  Psychiatric:        Mood and Affect: Mood normal.        Behavior: Behavior normal.        Thought Content: Thought content normal.        Assessment & Plan:     ICD-10-CM   1. Mohs defect of left scalp  M95.2     I am very happy for Tony Cook.  He has been extremely patient and showing some great signs of healing.  I would like him to come back in 2 to 3 weeks and hopefully we can put some donated a cell on the area.  Pictures were obtained of the patient and placed in the chart with the patient's or guardian's permission.

## 2019-12-10 ENCOUNTER — Telehealth: Payer: Self-pay

## 2019-12-10 NOTE — Telephone Encounter (Signed)
Faxed signed order from [employer], [place]- Adaptic 3x3 and Hycol Powder 1G

## 2019-12-31 ENCOUNTER — Encounter: Payer: Self-pay | Admitting: Surgical

## 2019-12-31 ENCOUNTER — Other Ambulatory Visit: Payer: Self-pay

## 2019-12-31 ENCOUNTER — Ambulatory Visit (INDEPENDENT_AMBULATORY_CARE_PROVIDER_SITE_OTHER): Payer: BC Managed Care – PPO | Admitting: Surgical

## 2019-12-31 VITALS — BP 140/78 | HR 69 | Temp 96.8°F

## 2019-12-31 DIAGNOSIS — M952 Other acquired deformity of head: Secondary | ICD-10-CM

## 2019-12-31 DIAGNOSIS — Z9889 Other specified postprocedural states: Secondary | ICD-10-CM

## 2019-12-31 NOTE — Progress Notes (Signed)
Patient is a 54 year old male here for follow-up on his scalp wound.  He has made great improvements over the past 3 weeks, all of the granulation tissue has epithelialized.  He does have a few areas of exposed bone that is desiccated.  There is no granulation tissue in this area.  He has been applying collagen powder to this and hydrating it 2-3 times daily with K-Y jelly.  On exam he has some slight periwound erythema, no foul odor noted.  Significant amount of new epithelialization noted centrally.  Some desiccated bone noted at 6:00 mark that is approximately 0.3 x 0.6 cm.  He also has a few small islands of desiccated bone noted medially.  Patient has a normal appearance, alert and oriented, no acute distress.  Normal mood and normal behavior.  Donated ACell applied to inferior aspect and medial aspect where desiccated bone was noted, Adaptic and K-Y jelly, nonstick gauze applied.  Recommend applying K-Y jelly daily to Adaptic.  Begin using cellerate powder in a few days once ACell has incorporated.  Call with any questions or concerns, follow-up in 3 weeks for reevaluation. Pictures were obtained of the patient and placed in the chart with the patient's or guardian's permission.

## 2020-01-21 ENCOUNTER — Telehealth: Payer: Self-pay | Admitting: Surgical

## 2020-01-21 NOTE — Telephone Encounter (Signed)
Patient called to say that a new order needs to be put in with [facility]. He said that he has been out of supplies 01/02/20. He said last time he received 30count, but needs 60ct to have enough. He's run out of the Adaptix 3x3. Please call patient to advise.

## 2020-01-23 NOTE — Telephone Encounter (Signed)
Called and [facility] @ 2:57pm) regarding wound supplies.//AB/CMA

## 2020-01-24 NOTE — Telephone Encounter (Addendum)
Faxed order to [facility] to renew supplies for the patient.  Confirmation received and copy scanned into the chart.  Received Order Status Notification.  Stating:[facility] has provided service for the patient; no further action is required.//AB/CMA

## 2020-01-28 ENCOUNTER — Encounter: Payer: Self-pay | Admitting: Surgical

## 2020-01-28 ENCOUNTER — Ambulatory Visit (INDEPENDENT_AMBULATORY_CARE_PROVIDER_SITE_OTHER): Payer: BC Managed Care – PPO | Admitting: Surgical

## 2020-01-28 ENCOUNTER — Other Ambulatory Visit: Payer: Self-pay

## 2020-01-28 VITALS — BP 147/86 | HR 59 | Temp 98.4°F

## 2020-01-28 DIAGNOSIS — Z9889 Other specified postprocedural states: Secondary | ICD-10-CM

## 2020-01-28 DIAGNOSIS — M952 Other acquired deformity of head: Secondary | ICD-10-CM | POA: Diagnosis not present

## 2020-01-28 NOTE — Progress Notes (Signed)
Patient is a 54 year old male here for follow-up on his scalp wound.  Patient had a squamous cell carcinoma excised followed by radiation.  He has been doing collagen powder daily followed by Adaptic and K-Y jelly.    The majority of the wound has now epithelialized with the exception of some medial pinpoint wounds and desiccated bone inferiorly at the 6:00 mark.  There is no foul odors or purulent drainage noted.  Erythema surrounding the scalp wound from radiation damage has improved.  Donated ACell was applied to the inferior aspect where desiccated bone is present, Adaptic and K-Y jelly, nonstick gauze also applied. Provided patient with a sample of endoform dressing to apply once the ACell has incorporated.  Apply endoform daily or every other day depending on speed of incorporation followed by Adaptic, K-Y jelly, nonstick gauze.    Recommend using increased amount of K-Y jelly to improve upon the desiccated bone. We discussed scheduling follow-up for 3 to 4 weeks. Picture was obtained to the patient and placed in the chart with the patient's permission. I recommend he call with any questions or concerns.

## 2020-03-03 ENCOUNTER — Encounter: Payer: Self-pay | Admitting: Surgical

## 2020-03-03 ENCOUNTER — Ambulatory Visit (INDEPENDENT_AMBULATORY_CARE_PROVIDER_SITE_OTHER): Payer: BC Managed Care – PPO | Admitting: Surgical

## 2020-03-03 ENCOUNTER — Other Ambulatory Visit: Payer: Self-pay

## 2020-03-03 VITALS — BP 139/86 | HR 71 | Temp 98.6°F

## 2020-03-03 DIAGNOSIS — M952 Other acquired deformity of head: Secondary | ICD-10-CM

## 2020-03-03 DIAGNOSIS — Z9889 Other specified postprocedural states: Secondary | ICD-10-CM

## 2020-03-03 NOTE — Progress Notes (Signed)
Patient is a 54 year old male here for follow-up on his scalp wound after excision of a squamous cell carcinoma followed by radiation.  He has continued to do collagen powder and endoform dressing changes daily followed by Adaptic and K-Y jelly.  He has seen minimal improvement over the past few weeks.  He continues to have the desiccated bone on the inferior aspect around 6:00 mark.  He has not had any infectious symptoms.  The radiation damage surrounding skin has improved.  On exam left scalp wound with area of desiccated bone noted inferiorly.  No surrounding erythema.  No foul odor is noted.  No purulent drainage noted.  Surrounding skin intact.  He does have a few small areas of fibrinous exudate and desiccated bone along the midline portion.  We discussed options for further reconstruction involving possible rotational flaps.  We also discussed continuing local wound care.  At this time patient would like to continue with local wound care.  He would like to follow-up in 6 weeks for reevaluation to further discuss reconstructive options with Dr. Ulice Bold.  I discussed with the patient that if he has any questions or concerns prior to his follow-up visit please call us with any questions or concerns.  Pictures were obtained of the patient and placed in the chart with the patient's or guardian's permission.

## 2020-04-21 ENCOUNTER — Encounter: Payer: Self-pay | Admitting: Plastic Surgery

## 2020-04-21 ENCOUNTER — Ambulatory Visit (INDEPENDENT_AMBULATORY_CARE_PROVIDER_SITE_OTHER): Payer: BC Managed Care – PPO | Admitting: Plastic Surgery

## 2020-04-21 ENCOUNTER — Other Ambulatory Visit: Payer: Self-pay

## 2020-04-21 VITALS — BP 145/90 | HR 88 | Temp 97.8°F

## 2020-04-21 DIAGNOSIS — M952 Other acquired deformity of head: Secondary | ICD-10-CM

## 2020-04-21 DIAGNOSIS — Z9889 Other specified postprocedural states: Secondary | ICD-10-CM | POA: Diagnosis not present

## 2020-04-21 NOTE — Progress Notes (Signed)
   Subjective:    Patient ID: Tony Cook, male    DOB: Feb 07, 1966, 55 y.o.   MRN: [id number]  The patient is a 55 year old male here with his sister for follow-up on his scalp wound.  He had been cancer and has been undergoing wound care treatment with [place] for several months.  We have tried several different treatments and he has done amazingly well.  There is a 5 x 20 mm area of exposed bone on the anterior aspect of the wound.  There is another little area more superior to this that is open.  He has run out of some of the supplies.  Most recently he has been using endoform.  He is really good about placing K-Y jelly on the area.  There is no sign of infection and overall he is doing extremely well.     Review of Systems  Constitutional: Negative.   Eyes: Negative.   Respiratory: Negative.   Cardiovascular: Negative.   Gastrointestinal: Negative.   Genitourinary: Negative.   Musculoskeletal: Negative.   Skin: Positive for color change and wound.  Hematological: Negative.   Psychiatric/Behavioral: Negative.        Objective:   Physical Exam Vitals and nursing note reviewed.  Constitutional:      Appearance: Normal appearance.  HENT:     Head: Normocephalic.   Cardiovascular:     Rate and Rhythm: Normal rate.     Pulses: Normal pulses.  Pulmonary:     Effort: Pulmonary effort is normal.  Neurological:     General: No focal deficit present.     Mental Status: He is alert. Mental status is at baseline.  Psychiatric:        Mood and Affect: Mood normal.        Behavior: Behavior normal.         Assessment & Plan:     ICD-10-CM   1. Mohs defect of left scalp  M95.2    Z98.890      We talked about the option reexcision.  He would like to give it 1 more month before any surgical intervention.  I gave him some more endoform to put on and he can change that either daily or every other day.  I had like to see him back in 1 month. Pictures were obtained of the patient  and placed in the chart with the patient's or guardian's permission.

## 2020-05-28 ENCOUNTER — Ambulatory Visit: Payer: BC Managed Care – PPO | Admitting: Surgical

## 2020-06-02 ENCOUNTER — Telehealth: Payer: Self-pay | Admitting: Surgical

## 2020-06-02 ENCOUNTER — Other Ambulatory Visit: Payer: Self-pay

## 2020-06-02 ENCOUNTER — Ambulatory Visit (INDEPENDENT_AMBULATORY_CARE_PROVIDER_SITE_OTHER): Payer: BC Managed Care – PPO | Admitting: Surgical

## 2020-06-02 ENCOUNTER — Encounter: Payer: Self-pay | Admitting: Surgical

## 2020-06-02 VITALS — BP 155/91

## 2020-06-02 DIAGNOSIS — M952 Other acquired deformity of head: Secondary | ICD-10-CM | POA: Diagnosis not present

## 2020-06-02 DIAGNOSIS — Z9889 Other specified postprocedural states: Secondary | ICD-10-CM

## 2020-06-02 DIAGNOSIS — T300 Burn of unspecified body region, unspecified degree: Secondary | ICD-10-CM

## 2020-06-02 DIAGNOSIS — L658 Other specified nonscarring hair loss: Secondary | ICD-10-CM | POA: Diagnosis not present

## 2020-06-02 NOTE — Progress Notes (Signed)
   Subjective:     Patient ID: Tony Cook, male    DOB: [birth year], 55 y.o.   MRN: [id number]  Chief Complaint  Patient presents with  . Follow-up    HPI: The patient is a 55 y.o. male here for follow-up for his left frontal scalp wound.  He has been doing collagen, endoform and Adaptic K-Y jelly dressing changes daily.  He last saw Dr. Ulice Bold on 04/21/2020 and they discussed possible surgical intervention.   Today he has some questions about the proposed surgical intervention.   Review of Systems  Constitutional: Negative.   Respiratory: Negative.     Objective:   Vital Signs BP (!) 155/91 (BP Location: Left Arm, Patient Position: Sitting, Cuff Size: Normal)   SpO2 99%  Vital Signs and Nursing Note Reviewed Physical Exam Constitutional:      Appearance: Normal appearance. He is normal weight.  HENT:     Head:   Musculoskeletal:     Cervical back: Normal range of motion.  Neurological:     General: No focal deficit present.     Mental Status: He is alert and oriented to person, place, and time. Mental status is at baseline.  Psychiatric:        Mood and Affect: Mood normal.        Behavior: Behavior normal.     Assessment/Plan:     ICD-10-CM   1. Mohs defect of left scalp  M95.2    Z98.890   2. 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 We discussed the proposed surgical intervention would be debridement of the desiccated bone and placement of a wound matrix to promote new tissue growth and granulation tissue.  I discussed with him that given the desiccation of the bone it may be difficult for new granulation tissue to form without promoting new punctate bleeding.  I discussed the postsurgical plan and downtime with the patient and his sister.  All of their questions were answered to their content.  He would like some time to think this over.  Recommend  calling with questions or concerns. Follow up scheduled for 1 month  Pictures were obtained of the patient and placed in the chart with the patient's or guardian's permission.    Kermit Balo Brenya Taulbee, PA-C 06/02/2020, 4:08 PM

## 2020-06-02 NOTE — Telephone Encounter (Signed)
As patient was leaving, he requested more supplies to be ordered for him. He requested powder, adaptik, and endiform.

## 2020-06-03 ENCOUNTER — Telehealth: Payer: Self-pay

## 2020-06-03 NOTE — Telephone Encounter (Signed)
Faxed order to [facility]: collagen, adaptic, endoform, and surgilube-change daily.

## 2020-06-03 NOTE — Telephone Encounter (Signed)
error 

## 2020-08-04 ENCOUNTER — Ambulatory Visit (INDEPENDENT_AMBULATORY_CARE_PROVIDER_SITE_OTHER): Payer: BC Managed Care – PPO | Admitting: Plastic Surgery

## 2020-08-04 ENCOUNTER — Other Ambulatory Visit: Payer: Self-pay

## 2020-08-04 ENCOUNTER — Encounter: Payer: Self-pay | Admitting: Plastic Surgery

## 2020-08-04 DIAGNOSIS — M952 Other acquired deformity of head: Secondary | ICD-10-CM

## 2020-08-04 DIAGNOSIS — Z9889 Other specified postprocedural states: Secondary | ICD-10-CM

## 2020-08-04 NOTE — Progress Notes (Signed)
   Subjective:    Patient ID: Tony Cook, male    DOB: [birth year], 55 y.o.   MRN: [id number]  The patient is a 55 year old male here for follow-up on his scalp wound.  The patient was treated for skin cancer on the scalp and had excision and radiation.  The radiation has made it very difficult to heal the wound.  This has been going on for a year and a half.  He has had remarkable improvement and healing.  He has had different treatments and skin substitutes.  It has been slow but it has been progressively improving.  On the anteriormost portion of the wound there is still some bone exposed it is about 0.5 x 1.5 cm in length.  No sign of infection and the periwound area is markedly improved with a decrease in the overall redness he had early on.     Review of Systems  Constitutional: Negative.   HENT: Negative.   Eyes: Negative.   Respiratory: Negative.   Gastrointestinal: Negative.   Endocrine: Negative.   Genitourinary: Negative.   Musculoskeletal: Negative.   Skin: Positive for color change and wound.  Psychiatric/Behavioral: Negative.        Objective:   Physical Exam Vitals and nursing note reviewed.  Constitutional:      Appearance: Normal appearance.  HENT:     Head:   Cardiovascular:     Rate and Rhythm: Normal rate.  Skin:    General: Skin is warm.     Capillary Refill: Capillary refill takes less than 2 seconds.  Neurological:     Mental Status: He is alert. Mental status is at baseline.  Psychiatric:        Mood and Affect: Mood normal.        Behavior: Behavior normal.        Thought Content: Thought content normal.        Assessment & Plan:     ICD-10-CM   1. Mohs defect of left scalp  M95.2    Z98.890     We discussed options for continuing with local wound care or surgical intervention.  He would like to try Endoform and continue with local care.  This is reasonable since he has been improving.  I would like to see him back in one month.  [facility]  form will be submitted.  Pictures were obtained of the patient and placed in the chart with the patient's or guardian's permission.

## 2020-08-05 ENCOUNTER — Telehealth: Payer: Self-pay

## 2020-08-05 NOTE — Telephone Encounter (Signed)
Faxed order to [facility]: Surgilube, adaptic 3x3, endoform 5x5- Change daily

## 2020-09-15 ENCOUNTER — Ambulatory Visit (INDEPENDENT_AMBULATORY_CARE_PROVIDER_SITE_OTHER): Payer: BC Managed Care – PPO | Admitting: Plastic Surgery

## 2020-09-15 ENCOUNTER — Encounter: Payer: Self-pay | Admitting: Plastic Surgery

## 2020-09-15 ENCOUNTER — Other Ambulatory Visit: Payer: Self-pay

## 2020-09-15 DIAGNOSIS — Z9889 Other specified postprocedural states: Secondary | ICD-10-CM

## 2020-09-15 DIAGNOSIS — T300 Burn of unspecified body region, unspecified degree: Secondary | ICD-10-CM

## 2020-09-15 DIAGNOSIS — M952 Other acquired deformity of head: Secondary | ICD-10-CM

## 2020-09-15 HISTORY — DX: Burn of unspecified body region, unspecified degree: T30.0

## 2020-09-15 NOTE — Progress Notes (Signed)
   Subjective:    Patient ID: Tony Cook, male    DOB: 08/21/1965, 55 y.o.   MRN: [id number]  The patient is a 55 year old male here for further evaluation of his scalp.  He had skin cancer was treated with excision and radiation prior to any reconstruction.  He was then sent to [place] and we have been managing the wound since then.  He is undergone debridement in the past with placement of skin substitute.  He has done an amazing job of taking care of this wound.  It is a very very difficult situation being that he was radiated.  At this point it appears that all of the open areas epithelialized except for 2 which have bone exposed.  Based on that I am concerned that they will epithelialize because there is healing around the area.  Therefore I think surgical management is essential at this time.  Nothing looks infected my concern is for bone to dry out become denuded.     Review of Systems  Constitutional: Negative.   HENT: Negative.    Eyes: Negative.   Respiratory: Negative.    Cardiovascular: Negative.   Gastrointestinal: Negative.   Endocrine: Negative.   Genitourinary: Negative.   Skin:  Positive for wound.  Psychiatric/Behavioral: Negative.        Objective:   Physical Exam Vitals and nursing note reviewed.  HENT:     Right Ear: External ear normal.     Left Ear: External ear normal.  Cardiovascular:     Rate and Rhythm: Normal rate.  Pulmonary:     Effort: Pulmonary effort is normal.  Musculoskeletal:        General: Deformity present. No swelling.  Skin:    Capillary Refill: Capillary refill takes less than 2 seconds.  Neurological:     Mental Status: He is alert. Mental status is at baseline.       Assessment & Plan:     ICD-10-CM   1. Mohs defect of left scalp  M95.2    Z98.890     2. Radiation burn  T30.0      Recommend burring down the bone that is exposed.  Possibly doing a tissue advancement of the left brow to cover the exposed bone that is on the  anterior portion of the wound.  And then ACell on the remaining portion the patient's go to think about it but we will get this submitted to see if we can get moving on scheduling it. Pictures were obtained of the patient and placed in the chart with the patient's or guardian's permission.

## 2020-10-05 ENCOUNTER — Telehealth: Payer: Self-pay | Admitting: Plastic Surgery

## 2020-10-05 NOTE — Telephone Encounter (Signed)
Numerous attempts have been made to patient, with a voicemail left on 7/13, 7/14, 7/19, and 7/25. I have sent a MyChart message to the patient requesting a return call. We are ready to schedule surgery when the patient returns my call.

## 2020-10-07 ENCOUNTER — Telehealth: Payer: Self-pay

## 2020-10-07 NOTE — Telephone Encounter (Signed)
Returned patients call. Verified the only supplies he needs is Adaptic 3x3 and sugilube. Faxed order to [facility].

## 2020-10-15 ENCOUNTER — Encounter: Payer: Self-pay | Admitting: Plastic Surgery

## 2020-10-15 ENCOUNTER — Ambulatory Visit (INDEPENDENT_AMBULATORY_CARE_PROVIDER_SITE_OTHER): Payer: BC Managed Care – PPO | Admitting: Plastic Surgery

## 2020-10-15 ENCOUNTER — Other Ambulatory Visit: Payer: Self-pay

## 2020-10-15 VITALS — BP 175/95 | HR 70

## 2020-10-15 DIAGNOSIS — T300 Burn of unspecified body region, unspecified degree: Secondary | ICD-10-CM | POA: Diagnosis not present

## 2020-10-15 DIAGNOSIS — M952 Other acquired deformity of head: Secondary | ICD-10-CM

## 2020-10-15 DIAGNOSIS — Z9889 Other specified postprocedural states: Secondary | ICD-10-CM | POA: Diagnosis not present

## 2020-10-15 NOTE — Progress Notes (Signed)
   Subjective:    Patient ID: Tony Cook, male    DOB: [birth year], 55 y.o.   MRN: [id number]  The patient is a 55 year old gentleman here for follow-up on his scalp wound.  He had a radiation burn secondary to a the radiation that was done for skin cancer.  The area has done extremely reasonably well and the open area is now approximately 5 x 30 mm on the anterior aspect of the wound.  There is clearly bone exposed and it is dried out and denuded.  I am very concerned that he will end up with an infection if we do not take care of this.  He has been doing an Artist job taking care of this keeping it hydrated.  I think we are at the point now if we do not do something I am really concerned.     Review of Systems  Constitutional: Negative.   HENT: Negative.    Eyes: Negative.   Respiratory: Negative.    Cardiovascular: Negative.   Gastrointestinal: Negative.   Endocrine: Negative.   Genitourinary: Negative.   Neurological: Negative.   Hematological: Negative.       Objective:   Physical Exam Vitals and nursing note reviewed.  Constitutional:      Appearance: Normal appearance.  HENT:     Head:   Cardiovascular:     Rate and Rhythm: Normal rate.     Pulses: Normal pulses.  Pulmonary:     Effort: Pulmonary effort is normal.  Skin:    Capillary Refill: Capillary refill takes less than 2 seconds.     Coloration: Skin is not jaundiced.     Findings: Lesion present. No bruising.  Neurological:     Mental Status: He is alert and oriented to person, place, and time.  Psychiatric:        Mood and Affect: Mood normal.        Behavior: Behavior normal.        Thought Content: Thought content normal.       Assessment & Plan:     ICD-10-CM   1. Radiation burn  T30.0     2. Mohs defect of left scalp  M95.2    Z98.890       Pictures were obtained of the patient and placed in the chart with the patient's or guardian's permission.  Plan for excision debridement of scalp  wound with ACell placement.  We will use a bur to do read the bone.  We will then plan on advancement of lifting the brow if possible.  The patient will likely agree but is going to think about it while we get this submitted.  He knows to give [place] call if he has not heard anything in the next 1 to 2 weeks.

## 2020-11-19 ENCOUNTER — Encounter: Payer: Self-pay | Admitting: Surgical

## 2020-11-19 ENCOUNTER — Ambulatory Visit (INDEPENDENT_AMBULATORY_CARE_PROVIDER_SITE_OTHER): Payer: BC Managed Care – PPO | Admitting: Surgical

## 2020-11-19 ENCOUNTER — Other Ambulatory Visit: Payer: Self-pay

## 2020-11-19 VITALS — BP 165/91 | HR 70 | Ht 67.0 in | Wt 166.6 lb

## 2020-11-19 DIAGNOSIS — M952 Other acquired deformity of head: Secondary | ICD-10-CM

## 2020-11-19 DIAGNOSIS — Z9889 Other specified postprocedural states: Secondary | ICD-10-CM

## 2020-11-19 DIAGNOSIS — T300 Burn of unspecified body region, unspecified degree: Secondary | ICD-10-CM

## 2020-11-19 DIAGNOSIS — L658 Other specified nonscarring hair loss: Secondary | ICD-10-CM

## 2020-11-19 MED ORDER — HYDROCODONE-ACETAMINOPHEN 5-325 MG PO TABS: 1.0000 | ORAL_TABLET | Freq: Four times a day (QID) | ORAL | 0 refills | Status: AC | PRN

## 2020-11-19 MED ORDER — ONDANSETRON HCL 4 MG PO TABS: 4.0000 mg | ORAL_TABLET | Freq: Three times a day (TID) | ORAL | 0 refills | Status: DC | PRN

## 2020-11-19 MED ORDER — CEPHALEXIN 500 MG PO CAPS: 500.0000 mg | ORAL_CAPSULE | Freq: Four times a day (QID) | ORAL | 0 refills | Status: AC

## 2020-11-19 NOTE — Progress Notes (Signed)
   Patient ID: Tony Cook, male    DOB: 05/01/1965, 55 y.o.   MRN: [id number]  Chief Complaint  Patient presents with   Pre-op Exam      ICD-10-CM   1. Radiation burn  T30.0     2. Mohs defect of left scalp  M95.2    Z98.890     3. 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  He presents for preoperative evaluation for upcoming procedure, excision/debridement of scalp wound and possible scalp advancement flap with possible application of Integra wound matrix, scheduled for 12/03/2020 with Dr. Dillingham.  The patient has not had problems with anesthesia. No history of DVT/PE.  No family history of DVT/PE.  No family or personal history of bleeding or clotting disorders.  Patient is not currently taking any blood thinners.  No history of CVA/MI.   Job: works in maintenance  PMH Significant for: radiation burn to scalp, SCC, radiation alopecia.   Past Medical History: Allergies: No Known Allergies  Current Medications:  Current Outpatient Medications:    cephALEXin (KEFLEX) 500 MG capsule, Take 1 capsule (500 mg total) by mouth 4 (four) times daily for 3 days., Disp: 12 capsule, Rfl: 0   HYDROcodone-acetaminophen (NORCO) 5-325 MG tablet, Take 1 tablet by mouth every 6 (six) hours as needed for up to 5 days for severe pain., Disp: 20 tablet, Rfl: 0   magnesium 30 MG tablet, Take 30 mg by mouth 2 (two) times daily., Disp: , Rfl:    Multiple Vitamins-Minerals (MULTIVITAMIN WITH MINERALS) tablet, Take 1 tablet by mouth daily., Disp: , Rfl:    ondansetron (ZOFRAN) 4 MG tablet, Take 1 tablet (4 mg total) by mouth every 8 (eight) hours as needed for nausea or vomiting., Disp: 20 tablet, Rfl: 0   vitamin B-12 (CYANOCOBALAMIN) 500 MCG tablet, Take 500 mcg by mouth daily., Disp: , Rfl:    vitamin C (ASCORBIC ACID) 250 MG  tablet, Take 250 mg by mouth daily., Disp: , Rfl:   Past Medical Problems: History reviewed. No pertinent past medical history.  Past Surgical History: Past Surgical History:  Procedure Laterality Date   APPLICATION OF A-CELL OF HEAD/NECK Left 04/15/2019   Procedure: APPLICATION OF A-CELL OF SCALP ;  Surgeon: Dillingham, Claire S, DO;  Location: [facility];  Service: Plastics;  Laterality: Left;   HERNIA REPAIR     INCISION AND DRAINAGE OF WOUND Left 03/13/2019   Procedure: Debridement of Mohs defect scalp with ACell or Integra placement;  Surgeon: Dillingham, Claire S, DO;  Location: [facility];  Service: Plastics;  Laterality: Left;  1 hour    Social History: Social History   Socioeconomic History   Marital status: Divorced    Spouse name: Not on file   Number of children: Not on file   Years of education: Not on file   Highest education level: Not on file  Occupational History   Not on file  Tobacco Use   Smoking status: Never   Smokeless tobacco: Current    Types: Snuff  Vaping Use   Vaping Use: Never used  Substance and Sexual Activity   Alcohol use: Yes    Alcohol/week: 20.0 standard drinks    Types: 20 Cans of beer per week    Comment: social     Drug use: Never   Sexual activity: Not on file  Other Topics Concern   Not on file  Social History Narrative   Lives alone   Social Determinants of Health   Financial Resource Strain: Not on file  Food Insecurity: Not on file  Transportation Needs: Not on file  Physical Activity: Not on file  Stress: Not on file  Social Connections: Not on file  Intimate Partner Violence: Not on file    Family History: Family History  Problem Relation Age of Onset   Stroke Father    Hypertension Father     Review of Systems: Review of Systems  Constitutional: Negative.   Respiratory: Negative.    Cardiovascular: Negative.   Gastrointestinal:  Positive for heartburn. Negative for nausea and  vomiting.  Neurological: Negative.    Physical Exam: Vital Signs BP (!) 165/91 (BP Location: Left Arm, Patient Position: Sitting, Cuff Size: Normal)   Pulse 70   Ht 5\' 7"  (1.702 m)   Wt 166 lb 9.6 oz (75.6 kg)   SpO2 100%   BMI 26.09 kg/m   Physical Exam Constitutional:      General: Not in acute distress.    Appearance: Normal appearance. Not ill-appearing.  HENT:     Head: Normocephalic and atraumatic.  Eyes:     Pupils: Pupils are equal, round Neck:     Musculoskeletal: Normal range of motion.  Cardiovascular:     Rate and Rhythm: Normal rate    Pulses: Normal pulses.  Pulmonary:     Effort: Pulmonary effort is normal. No respiratory distress.  Musculoskeletal: Normal range of motion.  Skin:    General: Skin is warm and dry.     Findings: No erythema or rash.  Neurological:     General: No focal deficit present.     Mental Status: Alert and oriented to person, place, and time. Mental status is at baseline.     Motor: No weakness.  Psychiatric:        Mood and Affect: Mood normal.        Behavior: Behavior normal.    Assessment/Plan: The patient is scheduled for excision/debridement of scalp wound and possible scalp advancement flap with possible application of Integra wound matrix with Dr. .  Risks, benefits, and alternatives of procedure discussed, questions answered and consent obtained.    Smoking Status: current tobacco user - uses chewing tobacco; Counseling Given? Discussed delayed healing risk  Caprini Score: 5, high; Risk Factors include: age, BMI > 25, chewing tobacco use and length of planned surgery. Recommendation for mechanical prophylaxis. Encourage early ambulation.   Pictures obtained: @consult   Post-op Rx sent to pharmacy: Norco, Zofran, Keflex  Patient was provided with the General Surgical Risk consent document and Pain Medication Agreement prior to their appointment.  They had adequate time to read through the risk consent documents  and Pain Medication Agreement. We also discussed them in person together during this preop appointment. All of their questions were answered to their satisfaction.  Recommended calling if they have any further questions.  Risk consent form and Pain Medication Agreement to be scanned into patient's chart.  We discussed personal risk factors for delayed healing including radiation damage, exposed bone. We discussed damage to surrounding structures including nerves, blood vessels.  Electronically signed by: Ulice Bold Kemar Pandit, PA-C 11/19/2020 4:04 PM

## 2020-11-19 NOTE — H&P (View-Only) (Signed)
Patient ID: Tony Cook, male    DOB: [birth year], 55 y.o.   MRN: [id number]  Chief Complaint  Patient presents with   Pre-op Exam      ICD-10-CM   1. Radiation burn  T30.0     2. Mohs defect of left scalp  M95.2    Z98.890     3. 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  He presents for preoperative evaluation for upcoming procedure, excision/debridement of scalp wound and possible scalp advancement flap with possible application of Integra wound matrix, scheduled for 12/03/2020 with Dr. Ulice Bold.  The patient has not had problems with anesthesia. No history of DVT/PE.  No family history of DVT/PE.  No family or personal history of bleeding or clotting disorders.  Patient is not currently taking any blood thinners.  No history of CVA/MI.   Job: works in maintenance  PMH Significant for: radiation burn to scalp, SCC, radiation alopecia.   Past Medical History: Allergies: No Known Allergies  Current Medications:  Current Outpatient Medications:    cephALEXin (KEFLEX) 500 MG capsule, Take 1 capsule (500 mg total) by mouth 4 (four) times daily for 3 days., Disp: 12 capsule, Rfl: 0   HYDROcodone-acetaminophen (NORCO) 5-325 MG tablet, Take 1 tablet by mouth every 6 (six) hours as needed for up to 5 days for severe pain., Disp: 20 tablet, Rfl: 0   magnesium 30 MG tablet, Take 30 mg by mouth 2 (two) times daily., Disp: , Rfl:    Multiple Vitamins-Minerals (MULTIVITAMIN WITH MINERALS) tablet, Take 1 tablet by mouth daily., Disp: , Rfl:    ondansetron (ZOFRAN) 4 MG tablet, Take 1 tablet (4 mg total) by mouth every 8 (eight) hours as needed for nausea or vomiting., Disp: 20 tablet, Rfl: 0   vitamin B-12 (CYANOCOBALAMIN) 500 MCG tablet, Take 500 mcg by mouth daily., Disp: , Rfl:    vitamin C (ASCORBIC ACID) 250 MG  tablet, Take 250 mg by mouth daily., Disp: , Rfl:   Past Medical Problems: History reviewed. No pertinent past medical history.  Past Surgical History: Past Surgical History:  Procedure Laterality Date   APPLICATION OF A-CELL OF HEAD/NECK Left 04/15/2019   Procedure: APPLICATION OF A-CELL OF SCALP ;  Surgeon: Peggye Form, DO;  Location: [facility];  Service: Plastics;  Laterality: Left;   HERNIA REPAIR     INCISION AND DRAINAGE OF WOUND Left 03/13/2019   Procedure: Debridement of Mohs defect scalp with ACell or Integra placement;  Surgeon: Peggye Form, DO;  Location: [facility];  Service: Plastics;  Laterality: Left;  1 hour    Social History: Social History   Socioeconomic History   Marital status: Divorced    Spouse name: Not on file   Number of children: Not on file   Years of education: Not on file   Highest education level: Not on file  Occupational History   Not on file  Tobacco Use   Smoking status: Never   Smokeless tobacco: Current    Types: Snuff  Vaping Use   Vaping Use: Never used  Substance and Sexual Activity   Alcohol use: Yes    Alcohol/week: 20.0 standard drinks    Types: 20 Cans of beer per week    Comment: social  Drug use: Never   Sexual activity: Not on file  Other Topics Concern   Not on file  Social History Narrative   Lives alone   Social Determinants of Health   Financial Resource Strain: Not on file  Food Insecurity: Not on file  Transportation Needs: Not on file  Physical Activity: Not on file  Stress: Not on file  Social Connections: Not on file  Intimate Partner Violence: Not on file    Family History: Family History  Problem Relation Age of Onset   Stroke Father    Hypertension Father     Review of Systems: Review of Systems  Constitutional: Negative.   Respiratory: Negative.    Cardiovascular: Negative.   Gastrointestinal:  Positive for heartburn. Negative for nausea and  vomiting.  Neurological: Negative.    Physical Exam: Vital Signs BP (!) 165/91 (BP Location: Left Arm, Patient Position: Sitting, Cuff Size: Normal)   Pulse 70   Ht 5\' 7"  (1.702 m)   Wt 166 lb 9.6 oz (75.6 kg)   SpO2 100%   BMI 26.09 kg/m   Physical Exam Constitutional:      General: Not in acute distress.    Appearance: Normal appearance. Not ill-appearing.  HENT:     Head: Normocephalic and atraumatic.  Eyes:     Pupils: Pupils are equal, round Neck:     Musculoskeletal: Normal range of motion.  Cardiovascular:     Rate and Rhythm: Normal rate    Pulses: Normal pulses.  Pulmonary:     Effort: Pulmonary effort is normal. No respiratory distress.  Musculoskeletal: Normal range of motion.  Skin:    General: Skin is warm and dry.     Findings: No erythema or rash.  Neurological:     General: No focal deficit present.     Mental Status: Alert and oriented to person, place, and time. Mental status is at baseline.     Motor: No weakness.  Psychiatric:        Mood and Affect: Mood normal.        Behavior: Behavior normal.    Assessment/Plan: The patient is scheduled for excision/debridement of scalp wound and possible scalp advancement flap with possible application of Integra wound matrix with Dr. .  Risks, benefits, and alternatives of procedure discussed, questions answered and consent obtained.    Smoking Status: current tobacco user - uses chewing tobacco; Counseling Given? Discussed delayed healing risk  Caprini Score: 5, high; Risk Factors include: age, BMI > 25, chewing tobacco use and length of planned surgery. Recommendation for mechanical prophylaxis. Encourage early ambulation.   Pictures obtained: @consult   Post-op Rx sent to pharmacy: Norco, Zofran, Keflex  Patient was provided with the General Surgical Risk consent document and Pain Medication Agreement prior to their appointment.  They had adequate time to read through the risk consent documents  and Pain Medication Agreement. We also discussed them in person together during this preop appointment. All of their questions were answered to their satisfaction.  Recommended calling if they have any further questions.  Risk consent form and Pain Medication Agreement to be scanned into patient's chart.  We discussed personal risk factors for delayed healing including radiation damage, exposed bone. We discussed damage to surrounding structures including nerves, blood vessels.  Electronically signed by: Ulice Bold Bryanne Riquelme, PA-C 11/19/2020 4:04 PM

## 2020-11-26 ENCOUNTER — Encounter (HOSPITAL_BASED_OUTPATIENT_CLINIC_OR_DEPARTMENT_OTHER): Payer: Self-pay | Admitting: Plastic Surgery

## 2020-11-26 ENCOUNTER — Other Ambulatory Visit: Payer: Self-pay

## 2020-12-03 ENCOUNTER — Encounter (HOSPITAL_BASED_OUTPATIENT_CLINIC_OR_DEPARTMENT_OTHER): Payer: Self-pay | Admitting: Plastic Surgery

## 2020-12-03 ENCOUNTER — Ambulatory Visit (HOSPITAL_BASED_OUTPATIENT_CLINIC_OR_DEPARTMENT_OTHER): Payer: BC Managed Care – PPO | Admitting: Certified Registered"

## 2020-12-03 ENCOUNTER — Ambulatory Visit (HOSPITAL_BASED_OUTPATIENT_CLINIC_OR_DEPARTMENT_OTHER)
Admission: RE | Admit: 2020-12-03 | Discharge: 2020-12-03 | Disposition: A | Discharge status: Home / Self Care | Payer: BC Managed Care – PPO | Attending: Plastic Surgery | Admitting: Plastic Surgery

## 2020-12-03 ENCOUNTER — Other Ambulatory Visit: Payer: Self-pay

## 2020-12-03 ENCOUNTER — Encounter (HOSPITAL_BASED_OUTPATIENT_CLINIC_OR_DEPARTMENT_OTHER)
Admission: RE | Disposition: A | Discharge status: Home / Self Care | Payer: Self-pay | Source: Home / Self Care | Attending: Plastic Surgery

## 2020-12-03 DIAGNOSIS — F17220 Nicotine dependence, chewing tobacco, uncomplicated: Secondary | ICD-10-CM | POA: Insufficient documentation

## 2020-12-03 DIAGNOSIS — Z85828 Personal history of other malignant neoplasm of skin: Secondary | ICD-10-CM | POA: Diagnosis not present

## 2020-12-03 DIAGNOSIS — M952 Other acquired deformity of head: Secondary | ICD-10-CM | POA: Diagnosis present

## 2020-12-03 DIAGNOSIS — L658 Other specified nonscarring hair loss: Secondary | ICD-10-CM | POA: Diagnosis not present

## 2020-12-03 DIAGNOSIS — Z9889 Other specified postprocedural states: Secondary | ICD-10-CM | POA: Diagnosis not present

## 2020-12-03 HISTORY — PX: DEBRIDEMENT AND CLOSURE WOUND: SHX5614

## 2020-12-03 SURGERY — DEBRIDEMENT, WOUND, WITH CLOSURE
Anesthesia: General | Site: Scalp

## 2020-12-03 MED ORDER — SODIUM CHLORIDE 0.9 % IV SOLN: 250.0000 mL | INTRAVENOUS | Status: DC | PRN

## 2020-12-03 MED ORDER — OXYCODONE HCL 5 MG PO TABS: 5.0000 mg | ORAL_TABLET | Freq: Once | ORAL | Status: DC | PRN

## 2020-12-03 MED ORDER — FIBRIN SEALANT 2 ML SINGLE DOSE KIT
PACK | CUTANEOUS | Status: DC | PRN
  Administered 2020-12-03: 2 mL via TOPICAL

## 2020-12-03 MED ORDER — FENTANYL CITRATE (PF) 100 MCG/2ML IJ SOLN
INTRAMUSCULAR | Status: AC
  Filled 2020-12-03: qty 2

## 2020-12-03 MED ORDER — FENTANYL CITRATE (PF) 100 MCG/2ML IJ SOLN: 25.0000 ug | INTRAMUSCULAR | Status: DC | PRN

## 2020-12-03 MED ORDER — EPHEDRINE 5 MG/ML INJ
INTRAVENOUS | Status: AC
  Filled 2020-12-03: qty 5

## 2020-12-03 MED ORDER — HYDROMORPHONE HCL 1 MG/ML IJ SOLN: 0.2500 mg | INTRAMUSCULAR | Status: DC | PRN

## 2020-12-03 MED ORDER — LACTATED RINGERS IV SOLN: INTRAVENOUS | Status: DC

## 2020-12-03 MED ORDER — FENTANYL CITRATE (PF) 100 MCG/2ML IJ SOLN
INTRAMUSCULAR | Status: DC | PRN
  Administered 2020-12-03: 100 ug via INTRAVENOUS

## 2020-12-03 MED ORDER — CHLORHEXIDINE GLUCONATE CLOTH 2 % EX PADS: 6.0000 | MEDICATED_PAD | Freq: Once | CUTANEOUS | Status: DC

## 2020-12-03 MED ORDER — PROMETHAZINE HCL 25 MG/ML IJ SOLN: 6.2500 mg | INTRAMUSCULAR | Status: DC | PRN

## 2020-12-03 MED ORDER — SODIUM CHLORIDE 0.9% FLUSH: 3.0000 mL | Freq: Two times a day (BID) | INTRAVENOUS | Status: DC

## 2020-12-03 MED ORDER — MUPIROCIN 2 % EX OINT
TOPICAL_OINTMENT | CUTANEOUS | Status: DC | PRN
  Administered 2020-12-03: 1 via TOPICAL

## 2020-12-03 MED ORDER — AMISULPRIDE (ANTIEMETIC) 5 MG/2ML IV SOLN: 10.0000 mg | Freq: Once | INTRAVENOUS | Status: DC | PRN

## 2020-12-03 MED ORDER — MIDAZOLAM HCL 2 MG/2ML IJ SOLN
INTRAMUSCULAR | Status: AC
  Filled 2020-12-03: qty 2

## 2020-12-03 MED ORDER — CEFAZOLIN SODIUM-DEXTROSE 2-4 GM/100ML-% IV SOLN
INTRAVENOUS | Status: AC
  Filled 2020-12-03: qty 100

## 2020-12-03 MED ORDER — DEXAMETHASONE SODIUM PHOSPHATE 4 MG/ML IJ SOLN
INTRAMUSCULAR | Status: DC | PRN
  Administered 2020-12-03: 5 mg via INTRAVENOUS

## 2020-12-03 MED ORDER — LIDOCAINE HCL (CARDIAC) PF 100 MG/5ML IV SOSY
PREFILLED_SYRINGE | INTRAVENOUS | Status: DC | PRN
  Administered 2020-12-03: 60 mg via INTRAVENOUS

## 2020-12-03 MED ORDER — LIDOCAINE-EPINEPHRINE 1 %-1:100000 IJ SOLN
INTRAMUSCULAR | Status: DC | PRN
  Administered 2020-12-03: 3 mL

## 2020-12-03 MED ORDER — OXYCODONE HCL 5 MG/5ML PO SOLN
5.0000 mg | Freq: Once | ORAL | Status: DC | PRN
Start: 2020-12-03 — End: 2020-12-03

## 2020-12-03 MED ORDER — PROPOFOL 10 MG/ML IV BOLUS
INTRAVENOUS | Status: DC | PRN
  Administered 2020-12-03: 200 mg via INTRAVENOUS

## 2020-12-03 MED ORDER — ONDANSETRON HCL 4 MG/2ML IJ SOLN
INTRAMUSCULAR | Status: AC
  Filled 2020-12-03: qty 2

## 2020-12-03 MED ORDER — MEPERIDINE HCL 25 MG/ML IJ SOLN: 6.2500 mg | INTRAMUSCULAR | Status: DC | PRN

## 2020-12-03 MED ORDER — CEFAZOLIN SODIUM-DEXTROSE 2-4 GM/100ML-% IV SOLN
2.0000 g | INTRAVENOUS | Status: AC
  Administered 2020-12-03: 2 g via INTRAVENOUS

## 2020-12-03 MED ORDER — ACETAMINOPHEN 325 MG PO TABS: 650.0000 mg | ORAL_TABLET | ORAL | Status: DC | PRN

## 2020-12-03 MED ORDER — ONDANSETRON HCL 4 MG/2ML IJ SOLN
INTRAMUSCULAR | Status: DC | PRN
  Administered 2020-12-03: 4 mg via INTRAVENOUS

## 2020-12-03 MED ORDER — MIDAZOLAM HCL 5 MG/5ML IJ SOLN
INTRAMUSCULAR | Status: DC | PRN
  Administered 2020-12-03: 2 mg via INTRAVENOUS

## 2020-12-03 MED ORDER — ACETAMINOPHEN 325 MG RE SUPP: 650.0000 mg | RECTAL | Status: DC | PRN

## 2020-12-03 MED ORDER — OXYCODONE HCL 5 MG PO TABS: 5.0000 mg | ORAL_TABLET | ORAL | Status: DC | PRN

## 2020-12-03 MED ORDER — SODIUM CHLORIDE 0.9% FLUSH: 3.0000 mL | INTRAVENOUS | Status: DC | PRN

## 2020-12-03 MED ORDER — EPHEDRINE SULFATE 50 MG/ML IJ SOLN
INTRAMUSCULAR | Status: DC | PRN
  Administered 2020-12-03: 10 mg via INTRAVENOUS

## 2020-12-03 MED ORDER — LIDOCAINE HCL (PF) 2 % IJ SOLN
INTRAMUSCULAR | Status: AC
  Filled 2020-12-03: qty 5

## 2020-12-03 SURGICAL SUPPLY — 63 items
ADH SKN CLS APL DERMABOND .7 (GAUZE/BANDAGES/DRESSINGS)
BLADE CLIPPER SURG (BLADE) IMPLANT
BLADE HEX COATED 2.75 (ELECTRODE) ×2 IMPLANT
BLADE SURG 10 STRL SS (BLADE) IMPLANT
BLADE SURG 15 STRL LF DISP TIS (BLADE) ×1 IMPLANT
BLADE SURG 15 STRL SS (BLADE) ×2
BNDG GAUZE ELAST 4 BULKY (GAUZE/BANDAGES/DRESSINGS) IMPLANT
BUR EGG 3PK/BX (BURR) ×2 IMPLANT
COVER BACK TABLE 60X90IN (DRAPES) ×2 IMPLANT
COVER MAYO STAND STRL (DRAPES) ×2 IMPLANT
DECANTER SPIKE VIAL GLASS SM (MISCELLANEOUS) IMPLANT
DERMABOND ADVANCED (GAUZE/BANDAGES/DRESSINGS)
DERMABOND ADVANCED .7 DNX12 (GAUZE/BANDAGES/DRESSINGS) IMPLANT
DRAPE INCISE IOBAN 66X45 STRL (DRAPES) IMPLANT
DRAPE LAPAROSCOPIC ABDOMINAL (DRAPES) IMPLANT
DRAPE LAPAROTOMY 100X72 PEDS (DRAPES) IMPLANT
DRAPE SURG 17X23 STRL (DRAPES) IMPLANT
DRAPE U-SHAPE 76X120 STRL (DRAPES) ×2 IMPLANT
DRESSING MEPILEX FLEX 4X4 (GAUZE/BANDAGES/DRESSINGS) ×1 IMPLANT
DRSG ADAPTIC 3X8 NADH LF (GAUZE/BANDAGES/DRESSINGS) IMPLANT
DRSG EMULSION OIL 3X3 NADH (GAUZE/BANDAGES/DRESSINGS) IMPLANT
DRSG HYDROCOLLOID 4X4 (GAUZE/BANDAGES/DRESSINGS) IMPLANT
DRSG MEPILEX FLEX 4X4 (GAUZE/BANDAGES/DRESSINGS) ×2
DRSG PAD ABDOMINAL 8X10 ST (GAUZE/BANDAGES/DRESSINGS) IMPLANT
DRSG TEGADERM 4X10 (GAUZE/BANDAGES/DRESSINGS) IMPLANT
DRSG TELFA 3X8 NADH (GAUZE/BANDAGES/DRESSINGS) IMPLANT
ELECT BLADE 4.0 EZ CLEAN MEGAD (MISCELLANEOUS) ×2
ELECT REM PT RETURN 9FT ADLT (ELECTROSURGICAL) ×2
ELECTRODE BLDE 4.0 EZ CLN MEGD (MISCELLANEOUS) ×1 IMPLANT
ELECTRODE REM PT RTRN 9FT ADLT (ELECTROSURGICAL) ×1 IMPLANT
EVACUATOR SILICONE 100CC (DRAIN) IMPLANT
GAUZE SPONGE 4X4 12PLY STRL (GAUZE/BANDAGES/DRESSINGS) ×2 IMPLANT
GAUZE XEROFORM 5X9 LF (GAUZE/BANDAGES/DRESSINGS) IMPLANT
GLOVE SURG ENC MOIS LTX SZ6 (GLOVE) ×4 IMPLANT
GOWN STRL REUS W/ TWL LRG LVL3 (GOWN DISPOSABLE) ×2 IMPLANT
GOWN STRL REUS W/TWL LRG LVL3 (GOWN DISPOSABLE) ×4
NEEDLE HYPO 25X1 1.5 SAFETY (NEEDLE) ×2 IMPLANT
NS IRRIG 1000ML POUR BTL (IV SOLUTION) ×2 IMPLANT
PACK BASIN DAY SURGERY FS (CUSTOM PROCEDURE TRAY) ×2 IMPLANT
PENCIL SMOKE EVACUATOR (MISCELLANEOUS) ×2 IMPLANT
SHEET MEDIUM DRAPE 40X70 STRL (DRAPES) IMPLANT
SLEEVE SCD COMPRESS KNEE MED (STOCKING) ×2 IMPLANT
SPONGE GAUZE 2X2 8PLY STRL LF (GAUZE/BANDAGES/DRESSINGS) ×2 IMPLANT
SPONGE T-LAP 18X18 ~~LOC~~+RFID (SPONGE) ×2 IMPLANT
STAPLER VISISTAT 35W (STAPLE) IMPLANT
SUCTION FRAZIER HANDLE 10FR (MISCELLANEOUS) ×1
SUCTION TUBE FRAZIER 10FR DISP (MISCELLANEOUS) ×1 IMPLANT
SUT MNCRL AB 3-0 PS2 18 (SUTURE) IMPLANT
SUT MNCRL AB 4-0 PS2 18 (SUTURE) IMPLANT
SUT MON AB 5-0 PS2 18 (SUTURE) IMPLANT
SUT SILK 3 0 PS 1 (SUTURE) IMPLANT
SUT VIC AB 4-0 P-3 18XBRD (SUTURE) ×1 IMPLANT
SUT VIC AB 4-0 P3 18 (SUTURE) ×2
SUT VICRYL 4-0 PS2 18IN ABS (SUTURE) ×2 IMPLANT
SWAB COLLECTION DEVICE MRSA (MISCELLANEOUS) IMPLANT
SWAB CULTURE ESWAB REG 1ML (MISCELLANEOUS) IMPLANT
SYR BULB IRRIG 60ML STRL (SYRINGE) IMPLANT
SYR CONTROL 10ML LL (SYRINGE) ×2 IMPLANT
TOWEL GREEN STERILE FF (TOWEL DISPOSABLE) ×4 IMPLANT
TRAY DSU PREP LF (CUSTOM PROCEDURE TRAY) ×2 IMPLANT
TUBE CONNECTING 20X1/4 (TUBING) ×2 IMPLANT
UNDERPAD 30X36 HEAVY ABSORB (UNDERPADS AND DIAPERS) ×2 IMPLANT
YANKAUER SUCT BULB TIP NO VENT (SUCTIONS) ×2 IMPLANT

## 2020-12-03 NOTE — Op Note (Signed)
DATE OF OPERATION: 12/03/2020  LOCATION: [facility]  PREOPERATIVE DIAGNOSIS: scalp wound 1.5 x 4 cm  POSTOPERATIVE DIAGNOSIS: Same  PROCEDURE:  Scalp advancement 1.5 x 4 cm 2.   Excision of nonviable skin and bone 0.3 x 4 cm scalp  SURGEON: Lissett Favorite Sanger Donny Heffern, DO  ASSISTANT: Evelena Leyden, PA  EBL: 1 cc  CONDITION: Stable  COMPLICATIONS: None  INDICATION: The patient, Tony Cook, is a 55 y.o. male born on October 17, 1965, is here for treatment of a chronic scalp wound.   PROCEDURE DETAILS:  The patient was seen prior to surgery and marked.  The IV antibiotics were given. The patient was taken to the operating room and given a general anesthetic. A standard time out was performed and all information was confirmed by those in the room. SCDs were placed.   The forehead was prepped and draped.  Local was injected for intraoperative hemostasis and postoperative pain control.  The #15 blade was used to excise the 0.3 x 4 cm area of nonviable skin at the anterior aspect of the wound.  The burr was used to excise the nonviable bone of the 0.3 x 4 cm area.  The tissue elevator was used to release the forehead skin and fascia from the frontal bone down to the orbital rim on the left.  The fascia was then separated from the soft tissue for more advancement.  This allowed a 1.5 cm advancement of the 4 cm area.  This was secured to the skull with the 4-0 Vicryl.  Evicel was used to help obtain coaptation of the soft tissue to the bone.  Bactroban was applied with a sterile dressing. The patient was allowed to wake up and taken to recovery room in stable condition at the end of the case. The family was notified at the end of the case.   The advanced practice practitioner (APP) assisted throughout the case.  The APP was essential in retraction and counter traction when needed to make the case progress smoothly.  This retraction and assistance made it possible to see the tissue  plans for the procedure.  The assistance was needed for blood control, tissue re-approximation and assisted with closure of the incision site.

## 2020-12-03 NOTE — Interval H&P Note (Signed)
History and Physical Interval Note:  12/03/2020 8:06 AM  Tony Cook  has presented today for surgery, with the diagnosis of mohs defect of scalp.  The various methods of treatment have been discussed with the patient and family. After consideration of risks, benefits and other options for treatment, the patient has consented to  Procedure(s): Excision/debridement of scalp wound, advancement scalp wound (N/A) APPLICATION OF Integra dermal regeneration (N/A) as a surgical intervention.  The patient's history has been reviewed, patient examined, no change in status, stable for surgery.  I have reviewed the patient's chart and labs.  Questions were answered to the patient's satisfaction.     [name]

## 2020-12-03 NOTE — Anesthesia Postprocedure Evaluation (Signed)
Anesthesia Post Note  Patient: Tony Cook  Procedure(s) Performed: Excision/debridement of scalp wound, advancement scalp wound (Scalp)     Patient location during evaluation: PACU Anesthesia Type: General Level of consciousness: awake and alert Pain management: pain level controlled Vital Signs Assessment: post-procedure vital signs reviewed and stable Respiratory status: spontaneous breathing, nonlabored ventilation and respiratory function stable Cardiovascular status: blood pressure returned to baseline and stable Postop Assessment: no apparent nausea or vomiting Anesthetic complications: no   No notable events documented.  Last Vitals:  Vitals:   12/03/20 1015 12/03/20 1017  BP: 124/82 122/86  Pulse: 66 68  Resp: 14   Temp:  36.9 C  SpO2: 100%     Last Pain:  Vitals:   12/03/20 1017  TempSrc:   PainSc: 0-No pain                 Lowella Curb

## 2020-12-03 NOTE — Anesthesia Procedure Notes (Signed)
Procedure Name: LMA Insertion Date/Time: 12/03/2020 8:48 AM Performed by: Lance Coon, CRNA Pre-anesthesia Checklist: Patient identified, Emergency Drugs available, Suction available and Patient being monitored Patient Re-evaluated:Patient Re-evaluated prior to induction Oxygen Delivery Method: Circle system utilized Preoxygenation: Pre-oxygenation with 100% oxygen Induction Type: IV induction Ventilation: Mask ventilation without difficulty LMA: LMA inserted LMA Size: 4.0 Number of attempts: 1 Airway Equipment and Method: Bite block Placement Confirmation: positive ETCO2 Tube secured with: Tape Dental Injury: Teeth and Oropharynx as per pre-operative assessment

## 2020-12-03 NOTE — Transfer of Care (Signed)
Immediate Anesthesia Transfer of Care Note  Patient: Tony Cook  Procedure(s) Performed: Excision/debridement of scalp wound, advancement scalp wound (Scalp)  Patient Location: PACU  Anesthesia Type:General  Level of Consciousness: awake  Airway & Oxygen Therapy: Patient Spontanous Breathing and Patient connected to face mask oxygen  Post-op Assessment: Report given to RN and Post -op Vital signs reviewed and stable  Post vital signs: Reviewed and stable  Last Vitals:  Vitals Value Taken Time  BP    Temp    Pulse 65 12/03/20 0956  Resp 12 12/03/20 0956  SpO2 100 % 12/03/20 0956  Vitals shown include unvalidated device data.  Last Pain:  Vitals:   12/03/20 0711  TempSrc: Oral  PainSc: 0-No pain      Patients Stated Pain Goal: 3 (12/03/20 0711)  Complications: No notable events documented.

## 2020-12-03 NOTE — Anesthesia Preprocedure Evaluation (Signed)
Anesthesia Evaluation  Patient identified by MRN, date of birth, ID band Patient awake    Reviewed: Allergy & Precautions, NPO status , Patient's Chart, lab work & pertinent test results  History of Anesthesia Complications Negative for: history of anesthetic complications  Airway Mallampati: I  TM Distance: >3 FB Neck ROM: Full    Dental no notable dental hx.    Pulmonary neg pulmonary ROS,    Pulmonary exam normal        Cardiovascular negative cardio ROS Normal cardiovascular exam     Neuro/Psych negative neurological ROS  negative psych ROS   GI/Hepatic negative GI ROS, Neg liver ROS,   Endo/Other  negative endocrine ROS  Renal/GU negative Renal ROS  negative genitourinary   Musculoskeletal negative musculoskeletal ROS (+)   Abdominal   Peds  Hematology negative hematology ROS (+)   Anesthesia Other Findings Mohs Defect Of Left Scalp  Reproductive/Obstetrics negative OB ROS                             Anesthesia Physical  Anesthesia Plan  ASA: 2  Anesthesia Plan: General   Post-op Pain Management:    Induction: Intravenous  PONV Risk Score and Plan: 2 and Treatment may vary due to age or medical condition, Ondansetron and Midazolam  Airway Management Planned: LMA  Additional Equipment: None  Intra-op Plan:   Post-operative Plan: Extubation in OR  Informed Consent: I have reviewed the patients History and Physical, chart, labs and discussed the procedure including the risks, benefits and alternatives for the proposed anesthesia with the patient or authorized representative who has indicated his/her understanding and acceptance.     Dental advisory given  Plan Discussed with: CRNA  Anesthesia Plan Comments:         Anesthesia Quick Evaluation

## 2020-12-03 NOTE — Discharge Instructions (Addendum)
Keep head elevated as able. Call with any questions. May shower from neck down tomorrow. Can get the area wet in 5 days.      Post Anesthesia Home Care Instructions  Activity: Get plenty of rest for the remainder of the day. A responsible individual must stay with you for 24 hours following the procedure.  For the next 24 hours, DO NOT: -Drive a car -Advertising copywriter -Drink alcoholic beverages -Take any medication unless instructed by your physician -Make any legal decisions or sign important papers.  Meals: Start with liquid foods such as gelatin or soup. Progress to regular foods as tolerated. Avoid greasy, spicy, heavy foods. If nausea and/or vomiting occur, drink only clear liquids until the nausea and/or vomiting subsides. Call your physician if vomiting continues.  Special Instructions/Symptoms: Your throat may feel dry or sore from the anesthesia or the breathing tube placed in your throat during surgery. If this causes discomfort, gargle with warm salt water. The discomfort should disappear within 24 hours.  If you had a scopolamine patch placed behind your ear for the management of post- operative nausea and/or vomiting:  1. The medication in the patch is effective for 72 hours, after which it should be removed.  Wrap patch in a tissue and discard in the trash. Wash hands thoroughly with soap and water. 2. You may remove the patch earlier than 72 hours if you experience unpleasant side effects which may include dry mouth, dizziness or visual disturbances. 3. Avoid touching the patch. Wash your hands with soap and water after contact with the patch.

## 2020-12-07 NOTE — Progress Notes (Signed)
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 Ulice Bold who presents for postoperative follow-up.  Today, patient states that he has been applying Bactroban followed by Adaptic, K-Y jelly, gauze, and wrapped with bandanna.  He has been doing daily dressing changes, morning and night.  He denies any redness, fevers or chills, significant pain or discomfort, streaking, swelling, drainage, bleeding, or other symptoms.  Physical exam is reassuring, no cellulitic findings.  Good moisture.  No bleeding or unusual drainage.  He can discontinue the Bactroban and continue with Adaptic dressing changes.  Plan is for him to return in 3 weeks for ongoing evaluation and management.  Any pictures obtained of the patient and placed in the chart were with the patient's or guardian's permission.

## 2020-12-08 ENCOUNTER — Encounter (HOSPITAL_BASED_OUTPATIENT_CLINIC_OR_DEPARTMENT_OTHER): Payer: Self-pay | Admitting: Plastic Surgery

## 2020-12-11 ENCOUNTER — Ambulatory Visit (INDEPENDENT_AMBULATORY_CARE_PROVIDER_SITE_OTHER): Payer: BC Managed Care – PPO | Admitting: Physician Assistant

## 2020-12-11 ENCOUNTER — Other Ambulatory Visit: Payer: Self-pay

## 2020-12-11 DIAGNOSIS — T300 Burn of unspecified body region, unspecified degree: Secondary | ICD-10-CM

## 2020-12-29 ENCOUNTER — Ambulatory Visit: Payer: BC Managed Care – PPO | Admitting: Physician Assistant

## 2020-12-29 ENCOUNTER — Other Ambulatory Visit: Payer: Self-pay

## 2020-12-29 DIAGNOSIS — T300 Burn of unspecified body region, unspecified degree: Secondary | ICD-10-CM

## 2020-12-29 NOTE — Progress Notes (Signed)
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 Ulice Bold who presents for postoperative follow-up.   Patient was last seen in the office here on 12/11/2020.  At that time, physical exam was reassuring.  There were no cellulitic findings.  He was without any significant complaints.  He had been performing twice daily daily dressing changes with Bactroban followed by Adaptic, K-Y jelly, gauze, and wrapped with bandanna.  Plan was for him to discontinue the Bactroban and continue with the Adaptic dressing changes.  Today, patient is doing well.  He states that the sutures occasionally will bother him, but otherwise no specific complaints.  Sutures used for advancement were 4-0 Vicryl.  The advancement of the tissue was difficult, sutures tore through easily.  Discussed with patient would prefer to leave them in at this time.  He voices understanding and is agreeable.  Denies any fevers, redness, drainage, foul odor, or other concerning symptoms.    Physical exam is reassuring.  Good wound healing.  There is a mildly irritated ridge at anterior/inferior aspect of wound where the advancement occurred.  No surrounding erythema or cellulitic findings noted.  Nontender to palpation.  4-0 sutures noted on exam.  No dehiscence.  Donated ACell applied anterior/inferior aspect.  Dressed with Adaptic, K-Y jelly, nonstick adherent pad, and then secured with bandanna.  Patient states that he does not feel as though follow-up warranted for another couple of months.  He knows he can call the clinic should he develop any new or worsening symptoms.  Picture(s) obtained of the patient and placed in the chart were with the patient's or guardian's permission.

## 2021-01-05 ENCOUNTER — Encounter: Payer: BC Managed Care – PPO | Admitting: Physician Assistant

## 2021-03-03 ENCOUNTER — Other Ambulatory Visit: Payer: Self-pay

## 2021-03-03 ENCOUNTER — Ambulatory Visit (INDEPENDENT_AMBULATORY_CARE_PROVIDER_SITE_OTHER): Payer: BC Managed Care – PPO | Admitting: Surgical

## 2021-03-03 DIAGNOSIS — Z9889 Other specified postprocedural states: Secondary | ICD-10-CM

## 2021-03-03 DIAGNOSIS — L658 Other specified nonscarring hair loss: Secondary | ICD-10-CM

## 2021-03-03 DIAGNOSIS — M952 Other acquired deformity of head: Secondary | ICD-10-CM

## 2021-03-03 DIAGNOSIS — T300 Burn of unspecified body region, unspecified degree: Secondary | ICD-10-CM

## 2021-03-03 NOTE — Progress Notes (Signed)
° °  Subjective:     Patient ID: Tony Cook, male    DOB: 07/21/1965, 55 y.o.   MRN: [id number]  Chief Complaint  Patient presents with   Follow-up    HPI: The patient is a 55 y.o. male here for follow-up on his scalp wound.  He underwent excision debridement of scalp wound and tissue advancement on 12/03/2020 with Dr. Ulice Bold.  He reports he is overall doing well.  He has continued with Adaptic, K-Y jelly and nonstick gauze dressing changes daily.  He is securing this with his banana.  He is feeling well.  Review of Systems  Constitutional: Negative.     Objective:   Vital Signs There were no vitals taken for this visit. Physical Exam Constitutional:      General: He is not in acute distress.    Appearance: Normal appearance. He is normal weight. He is not ill-appearing.  Pulmonary:     Effort: Pulmonary effort is normal.  Neurological:     Mental Status: He is alert.  Psychiatric:        Mood and Affect: Mood normal.        Behavior: Behavior normal.    Scalp: Pinpoint wounds noted, does have some scabbing superiorly.  No erythema or cellulitic changes.  The healed skin is quite thin and almost translucent.  The overall wound size on the inferior border is approximately 4 x 1 x 0.2.   Assessment/Plan:     ICD-10-CM   1. Radiation burn  T30.0     2. 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  He is doing well.  He has made some good improvement since his last visit a few months ago.  He is doing well with dressing changes.  He reports that he does not have any Adaptic left and may need some more for dressing changes.  We would like to cover the inferior portion of the wound with Adaptic and that total area is 4 x 1 x 0.2.  We will send a [facility] order for patient.  Recommend calling with questions or concerns. Follow up scheduled for 2  months.  Pictures were obtained of the patient and placed in the chart with the patient's or guardian's permission.   [name], PA-C 03/03/2021, 3:39 PM

## 2021-04-05 ENCOUNTER — Telehealth: Payer: Self-pay

## 2021-04-05 NOTE — Telephone Encounter (Signed)
03/04/2021-[facility]- They needed clarification for frequency. That was sent. They received. Supplies sent. Pt aware.

## 2021-04-26 ENCOUNTER — Telehealth: Payer: Self-pay

## 2021-04-26 NOTE — Telephone Encounter (Signed)
Called [facility] to order more Adaptic, spoke with Tomeka. She advised patient's order will expire this weekend and will need to be seen before they will send out more products. Called patient, LMVM in regards to supply.Next appointment is 05/04/2021 with [facility].

## 2021-04-26 NOTE — Telephone Encounter (Signed)
Patient called to request more 3x3 adaptic, 30-day supply.  Please call.

## 2021-05-04 ENCOUNTER — Ambulatory Visit (INDEPENDENT_AMBULATORY_CARE_PROVIDER_SITE_OTHER): Payer: BC Managed Care – PPO | Admitting: Surgical

## 2021-05-04 ENCOUNTER — Other Ambulatory Visit: Payer: Self-pay

## 2021-05-04 DIAGNOSIS — T300 Burn of unspecified body region, unspecified degree: Secondary | ICD-10-CM

## 2021-05-04 DIAGNOSIS — Z9889 Other specified postprocedural states: Secondary | ICD-10-CM | POA: Diagnosis not present

## 2021-05-04 DIAGNOSIS — M952 Other acquired deformity of head: Secondary | ICD-10-CM

## 2021-05-04 DIAGNOSIS — L658 Other specified nonscarring hair loss: Secondary | ICD-10-CM | POA: Diagnosis not present

## 2021-05-04 NOTE — Progress Notes (Signed)
° °  Subjective:     Patient ID: Reece Leader, male    DOB: [birth year], 56 y.o.   MRN: [id number]  No chief complaint on file.   HPI: The patient is a 56 y.o. male here for follow-up on his scalp wound.  He had a history of squamous cell carcinoma with subsequent excision and radiation.  This was approximately 2 years ago.  He subsequently had some he subsequently had debridement on [id number] and application of wound matrix and subsequent application of wound matrix on 04/15/2019.  He most recently underwent excision/debridement of scalp wound with advancement of scalp wound for coverage of the desiccated bone inferiorly.  He is doing well from this.  He does have some area of exposed bone still present along the left border.  He reports he has been doing Adaptic and K-Y jelly dressing changes, has not noticed much improvement.   Review of Systems  Constitutional: Negative.     Objective:   Vital Signs There were no vitals taken for this visit. Vital Signs and Nursing Note Reviewed  Physical Exam Constitutional:      General: He is not in acute distress.    Appearance: Normal appearance. He is not ill-appearing.  HENT:     Head:      Comments: Left anterior scalp wound.  The overall area of the scalp defect is 4 x 1 x 0.2.  He has some small pinpoint areas of desiccated bone that is noted where there has been no epithelialization. Neurological:     Mental Status: He is alert.      Assessment/Plan:     ICD-10-CM   1. Radiation burn  T30.0     2. Mohs defect of left scalp  M95.2    Z98.890     3. Radiation alopecia  L82.58       56 year old male here for follow-up on his left scalp wounds.  He has had this for quite some time.  He has a lot of barriers to healing due to the radiation and exposed bone.  We discussed today the possibility of a small advancement flap along the lateral portion at the midline to cover the remaining bone that is exposed.  He is not too  interested in the idea of undergoing anesthesia, but is going to think it over.  He is going to continue with local wound care, going to try endoform, K-Y jelly, Adaptic dressing changes daily.  We will order him some new supplies through [facility].  Recommend calling with questions or concerns. Follow up scheduled for 2 months  Pictures were obtained of the patient and placed in the chart with the patient's or guardian's permission.    Carola Rhine Lucielle Vokes, PA-C 05/04/2021, 4:47 PM

## 2021-05-05 ENCOUNTER — Telehealth: Payer: Self-pay

## 2021-05-05 NOTE — Telephone Encounter (Signed)
Faxed order to [facility] Adaptic, surgilube and telfa pads to be changed daily

## 2021-05-12 ENCOUNTER — Encounter: Payer: Self-pay | Admitting: Cardiology

## 2021-05-17 DIAGNOSIS — I10 Essential (primary) hypertension: Secondary | ICD-10-CM | POA: Insufficient documentation

## 2021-05-18 ENCOUNTER — Other Ambulatory Visit: Payer: Self-pay

## 2021-05-18 ENCOUNTER — Ambulatory Visit: Payer: BC Managed Care – PPO | Admitting: Cardiology

## 2021-05-18 ENCOUNTER — Encounter: Payer: Self-pay | Admitting: Cardiology

## 2021-05-18 VITALS — BP 120/82 | HR 76 | Ht 68.0 in | Wt 166.8 lb

## 2021-05-18 DIAGNOSIS — I10 Essential (primary) hypertension: Secondary | ICD-10-CM

## 2021-05-18 DIAGNOSIS — I480 Paroxysmal atrial fibrillation: Secondary | ICD-10-CM

## 2021-05-18 DIAGNOSIS — R011 Cardiac murmur, unspecified: Secondary | ICD-10-CM

## 2021-05-18 HISTORY — DX: Cardiac murmur, unspecified: R01.1

## 2021-05-18 HISTORY — DX: Paroxysmal atrial fibrillation: I48.0

## 2021-05-18 NOTE — Patient Instructions (Signed)
Medication Instructions:  Your physician recommends that you continue on your current medications as directed. Please refer to the Current Medication list given to you today.  *If you need a refill on your cardiac medications before your next appointment, please call your pharmacy*   Lab Work: None ordered If you have labs (blood work) drawn today and your tests are completely normal, you will receive your results only by: MyChart Message (if you have MyChart) OR A paper copy in the mail If you have any lab test that is abnormal or we need to change your treatment, we will call you to review the results.   Testing/Procedures: Your physician has requested that you have an echocardiogram. Echocardiography is a painless test that uses sound waves to create images of your heart. It provides your doctor with information about the size and shape of your heart and how well your hearts chambers and valves are working. This procedure takes approximately one hour. There are no restrictions for this procedure.  We will order CT coronary calcium score. It will cost $99.00 and is not covered by insurance.  Please call [phone] to schedule.   [facility]  [address]  [place], [place] [address]   Follow-Up: At [facility], you and your health needs are our priority.  As part of our continuing mission to provide you with exceptional heart care, we have created designated Provider Care Teams.  These Care Teams include your primary Cardiologist (physician) and Advanced Practice Providers (APPs -  Physician Assistants and Nurse Practitioners) who all work together to provide you with the care you need, when you need it.  We recommend signing up for the patient portal called "MyChart".  Sign up information is provided on this After Visit Summary.  MyChart is used to connect with patients for Virtual Visits (Telemedicine).  Patients are able to view lab/test results, encounter notes,  upcoming appointments, etc.  Non-urgent messages can be sent to your provider as well.   To learn more about what you can do with MyChart, go to [email or web address].    Your next appointment:   4 month(s)  The format for your next appointment:   In Person  Provider:   Belva Crome, MD   Other Instructions Echocardiogram An echocardiogram is a test that uses sound waves (ultrasound) to produce images of the heart. Images from an echocardiogram can provide important information about: Heart size and shape. The size and thickness and movement of your heart's walls. Heart muscle function and strength. Heart valve function or if you have stenosis. Stenosis is when the heart valves are too narrow. If blood is flowing backward through the heart valves (regurgitation). A tumor or infectious growth around the heart valves. Areas of heart muscle that are not working well because of poor blood flow or injury from a heart attack. Aneurysm detection. An aneurysm is a weak or damaged part of an artery wall. The wall bulges out from the normal force of blood pumping through the body. Tell a health care provider about: Any allergies you have. All medicines you are taking, including vitamins, herbs, eye drops, creams, and over-the-counter medicines. Any blood disorders you have. Any surgeries you have had. Any medical conditions you have. Whether you are pregnant or may be pregnant. What are the risks? Generally, this is a safe test. However, problems may occur, including an allergic reaction to dye (contrast) that may be used during the test. What happens before the test? No  specific preparation is needed. You may eat and drink normally. What happens during the test? You will take off your clothes from the waist up and put on a hospital gown. Electrodes or electrocardiogram (ECG)patches may be placed on your chest. The electrodes or patches are then connected to a device that monitors  your heart rate and rhythm. You will lie down on a table for an ultrasound exam. A gel will be applied to your chest to help sound waves pass through your skin. A handheld device, called a transducer, will be pressed against your chest and moved over your heart. The transducer produces sound waves that travel to your heart and bounce back (or "echo" back) to the transducer. These sound waves will be captured in real-time and changed into images of your heart that can be viewed on a video monitor. The images will be recorded on a computer and reviewed by your health care provider. You may be asked to change positions or hold your breath for a short time. This makes it easier to get different views or better views of your heart. In some cases, you may receive contrast through an IV in one of your veins. This can improve the quality of the pictures from your heart. The procedure may vary among health care providers and hospitals.   What can I expect after the test? You may return to your normal, everyday life, including diet, activities, and medicines, unless your health care provider tells you not to do that. Follow these instructions at home: It is up to you to get the results of your test. Ask your health care provider, or the department that is doing the test, when your results will be ready. Keep all follow-up visits. This is important. Summary An echocardiogram is a test that uses sound waves (ultrasound) to produce images of the heart. Images from an echocardiogram can provide important information about the size and shape of your heart, heart muscle function, heart valve function, and other possible heart problems. You do not need to do anything to prepare before this test. You may eat and drink normally. After the echocardiogram is completed, you may return to your normal, everyday life, unless your health care provider tells you not to do that. This information is not intended to replace  advice given to you by your health care provider. Make sure you discuss any questions you have with your health care provider. Document Revised: 10/22/2019 Document Reviewed: 10/22/2019 Elsevier Patient Education  2021 [employer].  Coronary Calcium Scan A coronary calcium scan is an imaging test used to look for deposits of plaque in the inner lining of the blood vessels of the heart (coronary arteries). Plaque is made up of calcium, protein, and fatty substances. These deposits of plaque can partly clog and narrow the coronary arteries without producing any symptoms or warning signs. This puts a person at risk for a heart attack. This test is recommended for people who are at moderate risk for heart disease. The test can find plaque deposits before symptoms develop. Tell a health care provider about: Any allergies you have. All medicines you are taking, including vitamins, herbs, eye drops, creams, and over-the-counter medicines. Any problems you or family members have had with anesthetic medicines. Any blood disorders you have. Any surgeries you have had. Any medical conditions you have. Whether you are pregnant or may be pregnant. What are the risks? Generally, this is a safe procedure. However, problems may occur, including: Harm to a  pregnant woman and her unborn baby. This test involves the use of radiation. Radiation exposure can be dangerous to a pregnant woman and her unborn baby. If you are pregnant or think you may be pregnant, you should not have this procedure done. Slight increase in the risk of cancer. This is because of the radiation involved in the test. What happens before the procedure? Ask your health care provider for any specific instructions on how to prepare for this procedure. You may be asked to avoid products that contain caffeine, tobacco, or nicotine for 4 hours before the procedure. What happens during the procedure?  You will undress and remove any jewelry  from your neck or chest. You will put on a hospital gown. Sticky electrodes will be placed on your chest. The electrodes will be connected to an electrocardiogram (ECG) machine to record a tracing of the electrical activity of your heart. You will lie down on a curved bed that is attached to the CT scanner. You may be given medicine to slow down your heart rate so that clear pictures can be created. You will be moved into the CT scanner, and the CT scanner will take pictures of your heart. During this time, you will be asked to lie still and hold your breath for 2-3 seconds at a time while each picture of your heart is being taken. The procedure may vary among health care providers and hospitals. What happens after the procedure? You can get dressed. You can return to your normal activities. It is up to you to get the results of your procedure. Ask your health care provider, or the department that is doing the procedure, when your results will be ready. Summary A coronary calcium scan is an imaging test used to look for deposits of plaque in the inner lining of the blood vessels of the heart (coronary arteries). Plaque is made up of calcium, protein, and fatty substances. Generally, this is a safe procedure. Tell your health care provider if you are pregnant or may be pregnant. Ask your health care provider for any specific instructions on how to prepare for this procedure. A CT scanner will take pictures of your heart. You can return to your normal activities after the scan is done. This information is not intended to replace advice given to you by your health care provider. Make sure you discuss any questions you have with your health care provider. Document Revised: 09/13/2018 Document Reviewed: 09/18/2018 Elsevier Patient Education  2022 [employer].

## 2021-05-18 NOTE — Progress Notes (Signed)
?Cardiology Office Note:   ? ?Date:  05/18/2021  ? ?ID:  Tony Cook, DOB 1965/04/09, MRN [id number] ? ?PCP:  Pcp, No  ?Cardiologist:  Garwin Brothers, MD  ? ?Referring MD: Eber Jones, NP  ? ? ?ASSESSMENT:   ? ?1. Essential (primary) hypertension   ?2. Cardiac murmur   ?3. Paroxysmal atrial fibrillation (HCC) post alcohol binge   ? ?PLAN:   ? ?In order of problems listed above: ? ?Primary prevention stressed with the patient.  Importance of compliance with diet medication stressed any vocalized understanding. ?Atrial fibrillation, paroxysmal after significant and heavy alcohol use: I reassured the patient about my findings.  He has had no palpitations or any clinical recurrences of atrial fibrillation.  He mentions to me that he has cut down alcohol use significantly and has had no symptoms.  I do not think this cause for anticoagulation especially in view of the fact that his [place] score is low and this was induced by heavy alcohol use.  I questioned him about this. ?Essential hypertension: Blood pressure stable and diet was emphasized. ?Cardiac murmur: Echocardiogram will be done to assess murmur heard on auscultation. ?Mixed dyslipidemia: I discussed this with the patient at length and we will do a calcium scoring CT scan to assess risk for coronary artery disease and address dyslipidemia accordingly.  He is agreeable. ?Tobacco abuse: I cautioned him about using tobacco.  He uses smokeless tobacco and chews tobacco.  Risks explained and he understands. ?Patient will be seen in follow-up appointment in 4 months or earlier if the patient has any concerns.  Again patient denies any other symptoms such as chest pain or shortness of breath on exertion. ? ? ? ?Medication Adjustments/Labs and Tests Ordered: ?Current medicines are reviewed at length with the patient today.  Concerns regarding medicines are outlined above.  ?No orders of the defined types were placed in this encounter. ? ?No orders of the defined  types were placed in this encounter. ? ? ? ?History of Present Illness:   ? ?Tony Cook is a 56 y.o. male who is being seen today for the evaluation of palpitations and atrial fibrillation found on monitoring at the request of Eber Jones, NP.  Patient is a pleasant 56 year old male.  He has past medical history that is not much significant.  He gives history of essential hypertension.  He mentions to me that he had palpitations and therefore underwent monitoring.  He tells me that atrial fibrillation was found on his monitoring.  He tells me that he knows why exactly it happened and it was because he took a heavy alcohol consumption on that day.  Subsequently standpoint no chest pain orthopnea PND or any palpitations.  He denies any chest pain.  He is working physically without any symptoms.  At the time of my evaluation, the patient is alert awake oriented and in no distress. ? ?Past Medical History:  ?Diagnosis Date  ? Essential (primary) hypertension   ? Mohs defect of left scalp 01/22/2019  ? Radiation burn 09/15/2020  ? ? ?Past Surgical History:  ?Procedure Laterality Date  ? APPLICATION OF A-CELL OF HEAD/NECK Left 04/15/2019  ? Procedure: APPLICATION OF A-CELL OF SCALP ;  Surgeon: Peggye Form, DO;  Location: [facility];  Service: Plastics;  Laterality: Left;  ? DEBRIDEMENT AND CLOSURE WOUND N/A 12/03/2020  ? Procedure: Excision/debridement of scalp wound, advancement scalp wound;  Surgeon: Peggye Form, DO;  Location: [facility];  Service: Government social research officer;  Laterality: N/A;  ? HERNIA REPAIR  1976  ? INCISION AND DRAINAGE OF WOUND Left 03/13/2019  ? Procedure: Debridement of Mohs defect scalp with ACell or Integra placement;  Surgeon: Peggye Form, DO;  Location: [facility];  Service: Plastics;  Laterality: Left;  1 hour  ? SKIN CANCER EXCISION  2020  ? Squamous Cell Cancer  ? ? ?Current Medications: ?Current Meds  ?Medication Sig  ?  Cholecalciferol (D3 PO) Take 2 tablets by mouth daily.  ? magnesium 30 MG tablet Take 30 mg by mouth 2 (two) times daily.  ? Multiple Vitamins-Minerals (MULTIVITAMIN WITH MINERALS) tablet Take 1 tablet by mouth daily.  ? olmesartan (BENICAR) 20 MG tablet Take 20 mg by mouth at bedtime.  ? UNABLE TO FIND Take 1 tablet by mouth daily. Med Name: Beet Root  ? vitamin B-12 (CYANOCOBALAMIN) 500 MCG tablet Take 500 mcg by mouth daily.  ? vitamin C (ASCORBIC ACID) 250 MG tablet Take 250 mg by mouth daily.  ?  ? ?Allergies:   Patient has no known allergies.  ? ?Social History  ? ?Socioeconomic History  ? Marital status: Divorced  ?  Spouse name: Not on file  ? Number of children: Not on file  ? Years of education: Not on file  ? Highest education level: Not on file  ?Occupational History  ? Not on file  ?Tobacco Use  ? Smoking status: Never  ? Smokeless tobacco: Current  ?  Types: Snuff  ?Vaping Use  ? Vaping Use: Never used  ?Substance and Sexual Activity  ? Alcohol use: Yes  ?  Alcohol/week: 56.0 standard drinks  ?  Types: 35 Cans of beer, 21 Shots of liquor per week  ?  Comment: social  ? Drug use: Never  ? Sexual activity: Not on file  ?Other Topics Concern  ? Not on file  ?Social History Narrative  ? Lives alone  ? ?Social Determinants of Health  ? ?Financial Resource Strain: Not on file  ?Food Insecurity: Not on file  ?Transportation Needs: Not on file  ?Physical Activity: Not on file  ?Stress: Not on file  ?Social Connections: Not on file  ?  ? ?Family History: ?The patient's family history includes Heart disease in his father; Hypertension in his father; Skin cancer in his father; Stroke in his father. ? ?ROS:   ?Please see the history of present illness.    ?All other systems reviewed and are negative. ? ?EKGs/Labs/Other Studies Reviewed:   ? ?The following studies were reviewed today: ?EKG reveals sinus rhythm and nonspecific ST-T changes ? ? ?Recent Labs: ?No results found for requested labs within last 8760  hours.  ?Recent Lipid Panel ?No results found for: CHOL, TRIG, HDL, CHOLHDL, VLDL, LDLCALC, LDLDIRECT ? ?Physical Exam:   ? ?VS:  BP 120/82   Pulse 76   Ht 5\' 8"  (1.727 m)   Wt 166 lb 12.8 oz (75.7 kg)   SpO2 99%   BMI 25.36 kg/m?    ? ?Wt Readings from Last 3 Encounters:  ?05/18/21 166 lb 12.8 oz (75.7 kg)  ?04/28/21 169 lb (76.7 kg)  ?12/03/20 163 lb 12.8 oz (74.3 kg)  ?  ? ?GEN: Patient is in no acute distress ?HEENT: Normal ?NECK: No JVD; No carotid bruits ?LYMPHATICS: No lymphadenopathy ?CARDIAC: S1 S2 regular, 2/6 systolic murmur at the apex. ?RESPIRATORY:  Clear to auscultation without rales, wheezing or rhonchi  ?ABDOMEN: Soft, non-tender, non-distended ?MUSCULOSKELETAL:  No edema; No deformity  ?  SKIN: Warm and dry ?NEUROLOGIC:  Alert and oriented x 3 ?PSYCHIATRIC:  Normal affect  ? ? ?Signed, ?Garwin Brothers, MD  ?05/18/2021 3:16 PM    ?[facility]   ?

## 2021-05-26 ENCOUNTER — Telehealth: Payer: Self-pay | Admitting: Cardiology

## 2021-05-26 ENCOUNTER — Other Ambulatory Visit: Payer: Self-pay

## 2021-05-26 ENCOUNTER — Ambulatory Visit (INDEPENDENT_AMBULATORY_CARE_PROVIDER_SITE_OTHER): Payer: BC Managed Care – PPO

## 2021-05-26 DIAGNOSIS — R011 Cardiac murmur, unspecified: Secondary | ICD-10-CM

## 2021-05-26 LAB — ECHOCARDIOGRAM COMPLETE
Area-P 1/2: 3.83 cm2
MV M vel: 4.58 m/s
MV Peak grad: 83.9 mmHg
Radius: 0.4 cm
S' Lateral: 3.5 cm

## 2021-05-26 NOTE — Telephone Encounter (Signed)
Patient c/o Palpitations:  High priority if patient c/o lightheadedness, shortness of breath, or chest pain ? ?How long have you had palpitations/irregular HR/ Afib? Are you having the symptoms now?  ?Palpitations developed a few hours ago. ?Still having symptoms. ? ?Are you currently experiencing lightheadedness, SOB or CP?  ?No  ? ?Do you have a history of afib (atrial fibrillation) or irregular heart rhythm?  ?No  ? ?Have you checked your BP or HR? (document readings if available):  ?No  ? ?Are you experiencing any other symptoms?  ?No  ? ? ?

## 2021-05-26 NOTE — Telephone Encounter (Signed)
Called patient and he stated that he had to leave work today because he felt his heart "fluttering". He took his blood pressure 140/91 and heart rate 115. He went home and took deep breaths to get his heart to slow down and he also took a baby aspirin. The fluttering in his chest has calmed down now and he feels better. His blood pressure and heart rate while on the phone was 149/92 and 74. He has no shortness of breath or chest pain and feels better. Will forward to Dr. Agustin Cree as he is the DOD. ?

## 2021-05-27 NOTE — Telephone Encounter (Signed)
Left vm for pt to callback 

## 2021-05-31 NOTE — Telephone Encounter (Signed)
Left vm for pt to callback 

## 2021-06-01 NOTE — Telephone Encounter (Signed)
Spoke with pt who states that his BP is better. Pt states that he also wore a heart monitor with his PCP. I have requested a copy of the report. ?

## 2021-06-06 ENCOUNTER — Emergency Department (HOSPITAL_COMMUNITY): Payer: BC Managed Care – PPO

## 2021-06-06 ENCOUNTER — Emergency Department (HOSPITAL_COMMUNITY)
Admission: EM | Admit: 2021-06-06 | Discharge: 2021-06-06 | Disposition: A | Discharge status: Home / Self Care | Payer: BC Managed Care – PPO | Attending: Emergency Medicine | Admitting: Emergency Medicine

## 2021-06-06 ENCOUNTER — Encounter (HOSPITAL_COMMUNITY): Payer: Self-pay | Admitting: Student

## 2021-06-06 DIAGNOSIS — Y900 Blood alcohol level of less than 20 mg/100 ml: Secondary | ICD-10-CM | POA: Insufficient documentation

## 2021-06-06 DIAGNOSIS — I10 Essential (primary) hypertension: Secondary | ICD-10-CM | POA: Diagnosis not present

## 2021-06-06 DIAGNOSIS — Z79899 Other long term (current) drug therapy: Secondary | ICD-10-CM | POA: Insufficient documentation

## 2021-06-06 DIAGNOSIS — I4891 Unspecified atrial fibrillation: Secondary | ICD-10-CM | POA: Diagnosis not present

## 2021-06-06 DIAGNOSIS — R000 Tachycardia, unspecified: Secondary | ICD-10-CM | POA: Diagnosis present

## 2021-06-06 LAB — COMPREHENSIVE METABOLIC PANEL
ALT: 20 U/L (ref 0–44)
AST: 23 U/L (ref 15–41)
Albumin: 3.9 g/dL (ref 3.5–5.0)
Alkaline Phosphatase: 49 U/L (ref 38–126)
Anion gap: 9 (ref 5–15)
BUN: 22 mg/dL — ABNORMAL HIGH (ref 6–20)
CO2: 24 mmol/L (ref 22–32)
Calcium: 9.2 mg/dL (ref 8.9–10.3)
Chloride: 103 mmol/L (ref 98–111)
Creatinine, Ser: 1.16 mg/dL (ref 0.61–1.24)
GFR, Estimated: >60 mL/min (ref >60)
Glucose, Bld: 127 mg/dL — ABNORMAL HIGH (ref 70–99)
Potassium: 3.6 mmol/L (ref 3.5–5.1)
Sodium: 136 mmol/L (ref 135–145)
Total Bilirubin: 0.5 mg/dL (ref 0.3–1.2)
Total Protein: 6.6 g/dL (ref 6.5–8.1)

## 2021-06-06 LAB — CBC
HCT: 44.3 % (ref 39.0–52.0)
Hemoglobin: 14.9 g/dL (ref 13.0–17.0)
MCH: 33.2 pg (ref 26.0–34.0)
MCHC: 33.6 g/dL (ref 30.0–36.0)
MCV: 98.7 fL (ref 80.0–100.0)
Platelets: 250 10*3/uL (ref 150–400)
RBC: 4.49 MIL/uL (ref 4.22–5.81)
RDW: 12.5 % (ref 11.5–15.5)
WBC: 7.9 10*3/uL (ref 4.0–10.5)
nRBC: 0 % (ref 0.0–0.2)

## 2021-06-06 LAB — I-STAT CHEM 8, ED
BUN: 29 mg/dL — ABNORMAL HIGH (ref 6–20)
Calcium, Ion: 1.19 mmol/L (ref 1.15–1.40)
Chloride: 103 mmol/L (ref 98–111)
Creatinine, Ser: 1.1 mg/dL (ref 0.61–1.24)
Glucose, Bld: 124 mg/dL — ABNORMAL HIGH (ref 70–99)
HCT: 45 % (ref 39.0–52.0)
Hemoglobin: 15.3 g/dL (ref 13.0–17.0)
Potassium: 4 mmol/L (ref 3.5–5.1)
Sodium: 138 mmol/L (ref 135–145)
TCO2: 27 mmol/L (ref 22–32)

## 2021-06-06 LAB — TROPONIN I (HIGH SENSITIVITY): Troponin I (High Sensitivity): 4 ng/L (ref <18)

## 2021-06-06 LAB — ETHANOL: Alcohol, Ethyl (B): <10 mg/dL (ref <10)

## 2021-06-06 MED ORDER — SODIUM CHLORIDE 0.9 % IV BOLUS
1000.0000 mL | Freq: Once | INTRAVENOUS | Status: AC
  Administered 2021-06-06: 1000 mL via INTRAVENOUS

## 2021-06-06 MED ORDER — METOPROLOL TARTRATE 25 MG PO TABS: 25.0000 mg | ORAL_TABLET | Freq: Every day | ORAL | 0 refills | Status: DC | PRN

## 2021-06-06 MED ORDER — DILTIAZEM HCL 25 MG/5ML IV SOLN: 10.0000 mg | Freq: Once | INTRAVENOUS | Status: DC

## 2021-06-06 NOTE — ED Provider Notes (Signed)
?[facility] ?Provider Note ? ? ?CSN: [id number] ?Arrival date & time: 06/06/21  2127 ? ?  ? ?History ? ?Chief Complaint  ?Patient presents with  ? Tachycardia  ?  Afib RVR  ? ? ?Tony Cook is a 56 y.o. male history of hypertension here presenting with tachycardia.  Patient states that he was drinking some beers and he food and was watching TV and felt that his heart was racing.  Denies any chest pain at that time.  Patient has a history of A-fib after drinking alcohol.  Patient is not on anticoagulation since his CHA2DS2-VASc score is low.  Patient has seen cardiology already and is scheduled for a CT coronary scan.  Patient was noted to be in rapid A-fib in triage ? ?The history is provided by the patient.  ? ?  ? ?Home Medications ?Prior to Admission medications   ?Medication Sig Start Date End Date Taking? Authorizing Provider  ?metoprolol tartrate (LOPRESSOR) 25 MG tablet Take 1 tablet (25 mg total) by mouth daily as needed (His heart rate is well above 100). 06/06/21  Yes Drenda Freeze, MD  ?Cholecalciferol (D3 PO) Take 2 tablets by mouth daily.    [provider]  ?magnesium 30 MG tablet Take 30 mg by mouth 2 (two) times daily.    [provider]  ?Multiple Vitamins-Minerals (MULTIVITAMIN WITH MINERALS) tablet Take 1 tablet by mouth daily.    [provider]  ?olmesartan (BENICAR) 20 MG tablet Take 20 mg by mouth at bedtime. 04/29/21   [provider]  ?UNABLE TO FIND Take 1 tablet by mouth daily. Med Name: Beet Root    [provider]  ?vitamin B-12 (CYANOCOBALAMIN) 500 MCG tablet Take 500 mcg by mouth daily.    [provider]  ?vitamin C (ASCORBIC ACID) 250 MG tablet Take 250 mg by mouth daily.    [provider]  ?   ? ?Allergies    ?Patient has no known allergies.   ? ?Review of Systems   ?Review of Systems  ?Cardiovascular:  Positive for palpitations.  ?All other systems reviewed and are  negative. ? ?Physical Exam ?Updated Vital Signs ?BP 129/89   Pulse 65   Temp 97.6 ?F (36.4 ?C) (Oral)   Resp 14   SpO2 100%  ?Physical Exam ?Vitals and nursing note reviewed.  ?Constitutional:   ?   Appearance: Normal appearance.  ?HENT:  ?   Head: Normocephalic.  ?   Nose: Nose normal.  ?   Mouth/Throat:  ?   Mouth: Mucous membranes are moist.  ?Eyes:  ?   Extraocular Movements: Extraocular movements intact.  ?   Pupils: Pupils are equal, round, and reactive to light.  ?Cardiovascular:  ?   Rate and Rhythm: Normal rate and regular rhythm.  ?Pulmonary:  ?   Effort: Pulmonary effort is normal.  ?   Breath sounds: Normal breath sounds.  ?Abdominal:  ?   General: Abdomen is flat.  ?   Palpations: Abdomen is soft.  ?Musculoskeletal:     ?   General: Normal range of motion.  ?   Cervical back: Normal range of motion and neck supple.  ?Skin: ?   General: Skin is warm.  ?   Capillary Refill: Capillary refill takes less than 2 seconds.  ?Neurological:  ?   General: No focal deficit present.  ?   Mental Status: He is alert and oriented to person, place, and time.  ?Psychiatric:     ?  Mood and Affect: Mood normal.     ?   Behavior: Behavior normal.  ? ? ?ED Results / Procedures / Treatments   ?Labs ?(all labs ordered are listed, but only abnormal results are displayed) ?Labs Reviewed  ?COMPREHENSIVE METABOLIC PANEL - Abnormal; Notable for the following components:  ?    Result Value  ? Glucose, Bld 127 (*)   ? BUN 22 (*)   ? All other components within normal limits  ?I-STAT CHEM 8, ED - Abnormal; Notable for the following components:  ? BUN 29 (*)   ? Glucose, Bld 124 (*)   ? All other components within normal limits  ?CBC  ?ETHANOL  ?TROPONIN I (HIGH SENSITIVITY)  ? ? ?EKG ?EKG Interpretation ? ?Date/Time:  Sunday June 06 2021 22:07:18 EDT ?Ventricular Rate:  64 ?PR Interval:  143 ?QRS Duration: 111 ?QT Interval:  380 ?QTC Calculation: 392 ?R Axis:   65 ?Text Interpretation: Sinus rhythm RSR' in V1 or V2, right VCD  or RVH previous EKG? showed afib Confirmed by Wandra Arthurs [phone]) on 06/06/2021 10:17:29 PM ? ?Radiology ?DG Chest Port 1 View ? ?Result Date: 06/06/2021 ?CLINICAL DATA:  Palpitations, tachycardia EXAM: PORTABLE CHEST 1 VIEW COMPARISON:  None. FINDINGS: Single frontal view of the chest demonstrates an unremarkable cardiac silhouette. No acute airspace disease, effusion, or pneumothorax. No acute bony abnormality. IMPRESSION: 1. No acute intrathoracic process. Electronically Signed   By: Randa Ngo M.D.   On: 06/06/2021 22:29   ? ?Procedures ?Procedures  ? ? ?Medications Ordered in ED ?Medications  ?sodium chloride 0.9 % bolus 1,000 mL (0 mLs Intravenous Stopped 06/06/21 2319)  ? ? ?ED Course/ Medical Decision Making/ A&P ?  ?                        ?Medical Decision Making ?Tony Cook is a 56 y.o. male presenting with palpitation.  Patient was in rapid A-fib on arrival.  When I saw the patient, patient went back into sinus rhythm.  Per chart review from cardiology, patient gets into A-fib after drinking alcohol.  Patient admits to drinking several beers today.  We will check CBC and CMP and EtOH.  We will get 1 set of troponin but patient does not have chest pain ? ?11:31 PM ?Labs are unremarkable.  Alcohol level was negative.  Patient remains in sinus rhythm.  At this time, patient is stable for discharge.  We will put patient on metoprolol as needed if heart rate persistently elevated.  Otherwise he has CT coronary scan scheduled with cardiology ? ? ?Problems Addressed: ?Atrial fibrillation with RVR ([place]): acute illness or injury ? ?Amount and/or Complexity of Data Reviewed ?Labs: ordered. Decision-making details documented in ED Course. ?Radiology: ordered and independent interpretation performed. Decision-making details documented in ED Course. ?ECG/medicine tests: ordered and independent interpretation performed. Decision-making details documented in ED Course. ? ?Risk ?Prescription drug  management. ? ? ? ?Final Clinical Impression(s) / ED Diagnoses ?Final diagnoses:  ?Atrial fibrillation with RVR ([place])  ? ? ?Rx / DC Orders ?ED Discharge Orders   ? ?      Ordered  ?  metoprolol tartrate (LOPRESSOR) 25 MG tablet  Daily PRN       ? 06/06/21 2327  ? ?  ?  ? ?  ? ? ?  ?Drenda Freeze, MD ?06/06/21 2332 ? ?

## 2021-06-06 NOTE — ED Triage Notes (Signed)
Pt arrives to ED POV c/o Tachycardia. Pt states he was at home watching tv and felt his heart "racing" and became very SHOB. EKG Show AFIB RVR HR 141. Pt denies Hx of same only Hx of HTN. Pt a/o x4  ?

## 2021-06-06 NOTE — Discharge Instructions (Addendum)
Continue current meds. ? ?Please avoid drinking alcohol. ? ?If your heart rate is above 100, please take metoprolol and wait an hour.  If your heart rate is consistently above 100 then come to the ER. ? ?You should follow-up with your cardiologist ? ?Return to ER if you have worse palpitation, chest pain, shortness of breath.  ? ? ?

## 2021-06-07 ENCOUNTER — Telehealth (HOSPITAL_COMMUNITY): Payer: Self-pay

## 2021-06-07 NOTE — Telephone Encounter (Signed)
Reached out to patient to schedule ED f/u. Patient stated he feels great and that if he need to schedule ED f/u he will call back ?

## 2021-06-29 ENCOUNTER — Ambulatory Visit: Payer: BC Managed Care – PPO | Admitting: Surgical

## 2021-06-29 DIAGNOSIS — L658 Other specified nonscarring hair loss: Secondary | ICD-10-CM | POA: Diagnosis not present

## 2021-06-29 DIAGNOSIS — M952 Other acquired deformity of head: Secondary | ICD-10-CM

## 2021-06-29 DIAGNOSIS — T300 Burn of unspecified body region, unspecified degree: Secondary | ICD-10-CM

## 2021-06-29 DIAGNOSIS — Z9889 Other specified postprocedural states: Secondary | ICD-10-CM

## 2021-06-29 NOTE — Progress Notes (Signed)
? ?  Referring Provider ?No referring provider defined for this encounter.  ? ?CC:  ?Chief Complaint  ?Patient presents with  ? Follow-up  ?   ? ?Tony Cook is an 56 y.o. male.  ?HPI: Patient is a 56 year old male here for follow-up on his scalp wound.  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  This was approximately 2 years ago. ? ?He subsequently had debridement and application of wound matrix on 03/13/2019 and additional application of wound matrix on 04/15/2019.  He subsequently underwent excision of nonviable skin and bone and scalp advancement flap on 12/03/2021 with Dr. Ulice Bold. ? ?He has been doing Adaptic and K-Y jelly dressing changes.  There has been some minor improvement in the last 2 months.  He has some questions about options for improving the contour and fullness of the area. ? ?Review of Systems ?General: No fevers or chills ? ?Physical Exam ? ?  06/06/2021  ? 11:34 PM 06/06/2021  ? 11:00 PM 06/06/2021  ? 10:45 PM  ?Vitals with BMI  ?Systolic 122 129 568  ?Diastolic 75 89 81  ?Pulse 60 65 72  ?  ?General:  No acute distress,  Alert and oriented, Non-Toxic, Normal speech and affect ?HEENT: Left anterior scalp wound, near complete epithelialization noted.  The epithelium is very thin, there is an indentation where there was tissue loss from the squamous cell excision.  He has some small pinpoint areas of desiccated bone, specifically the left lateral portion.  There is no erythema or cellulitic changes noted. ? ?Assessment/Plan ?56 year old male here for follow-up on his left scalp wound.  There has been very slow healing.  He is interested in options for improving the contour and shape.  Will discuss with surgical team and update patient with any recommendations. ? ?Recommend keeping the area covered while at work, he can leave it uncovered while at home.  Prevent the area from becoming soiled. ? ?Pictures were obtained of the patient and placed in the  chart with the patient's or guardian's permission. ? ? ? ? ?Tony Cook ?06/29/2021, 3:30 PM  ? ? ?    ?

## 2021-06-30 ENCOUNTER — Ambulatory Visit (INDEPENDENT_AMBULATORY_CARE_PROVIDER_SITE_OTHER)
Admission: RE | Admit: 2021-06-30 | Discharge: 2021-06-30 | Disposition: A | Discharge status: Home / Self Care | Payer: Self-pay | Source: Ambulatory Visit | Attending: Cardiology | Admitting: Cardiology

## 2021-06-30 DIAGNOSIS — I480 Paroxysmal atrial fibrillation: Secondary | ICD-10-CM

## 2021-06-30 DIAGNOSIS — I10 Essential (primary) hypertension: Secondary | ICD-10-CM

## 2021-07-01 ENCOUNTER — Telehealth: Payer: Self-pay

## 2021-07-01 DIAGNOSIS — R931 Abnormal findings on diagnostic imaging of heart and coronary circulation: Secondary | ICD-10-CM

## 2021-07-01 NOTE — Telephone Encounter (Signed)
-----   Message from Garwin Brothers, MD sent at 07/01/2021  2:46 PM EDT ----- ?Calcium score is elevated.  He needs to quit smoking.  Bring him in for a Chem-7 liver lipid in the next few days.  Copy primary care.  He will need statin therapy. ?Garwin Brothers, MD 07/01/2021 2:45 PM  ?

## 2021-07-10 LAB — HEPATIC FUNCTION PANEL
ALT: 12 IU/L (ref 0–44)
AST: 12 IU/L (ref 0–40)
Albumin: 4.3 g/dL (ref 3.8–4.9)
Alkaline Phosphatase: 57 IU/L (ref 44–121)
Bilirubin Total: 0.4 mg/dL (ref 0.0–1.2)
Bilirubin, Direct: 0.11 mg/dL (ref 0.00–0.40)
Total Protein: 6.4 g/dL (ref 6.0–8.5)

## 2021-07-10 LAB — LIPID PANEL
Chol/HDL Ratio: 3.6 ratio (ref 0.0–5.0)
Cholesterol, Total: 208 mg/dL — ABNORMAL HIGH (ref 100–199)
HDL: 57 mg/dL (ref >39)
LDL Chol Calc (NIH): 136 mg/dL — ABNORMAL HIGH (ref 0–99)
Triglycerides: 86 mg/dL (ref 0–149)
VLDL Cholesterol Cal: 15 mg/dL (ref 5–40)

## 2021-07-10 LAB — BASIC METABOLIC PANEL
BUN/Creatinine Ratio: 14 (ref 9–20)
BUN: 16 mg/dL (ref 6–24)
CO2: 26 mmol/L (ref 20–29)
Calcium: 9.2 mg/dL (ref 8.7–10.2)
Chloride: 104 mmol/L (ref 96–106)
Creatinine, Ser: 1.13 mg/dL (ref 0.76–1.27)
Glucose: 97 mg/dL (ref 70–99)
Potassium: 4.7 mmol/L (ref 3.5–5.2)
Sodium: 141 mmol/L (ref 134–144)
eGFR: 76 mL/min/{1.73_m2} (ref >59)

## 2021-07-13 MED ORDER — ROSUVASTATIN CALCIUM 10 MG PO TABS: 10.0000 mg | ORAL_TABLET | Freq: Every day | ORAL | 3 refills | Status: DC

## 2021-07-13 NOTE — Addendum Note (Signed)
Addended by: Eleonore Chiquito on: 07/13/2021 02:27 PM ? ? Modules accepted: Orders ? ?

## 2021-09-21 ENCOUNTER — Ambulatory Visit: Payer: BC Managed Care – PPO | Admitting: Cardiology

## 2021-09-21 ENCOUNTER — Encounter: Payer: Self-pay | Admitting: Cardiology

## 2021-09-21 VITALS — BP 122/76 | HR 68 | Ht 68.0 in | Wt 163.2 lb

## 2021-09-21 DIAGNOSIS — I10 Essential (primary) hypertension: Secondary | ICD-10-CM | POA: Diagnosis not present

## 2021-09-21 DIAGNOSIS — E782 Mixed hyperlipidemia: Secondary | ICD-10-CM

## 2021-09-21 DIAGNOSIS — I7121 Aneurysm of the ascending aorta, without rupture: Secondary | ICD-10-CM

## 2021-09-21 DIAGNOSIS — R931 Abnormal findings on diagnostic imaging of heart and coronary circulation: Secondary | ICD-10-CM

## 2021-09-21 HISTORY — DX: Abnormal findings on diagnostic imaging of heart and coronary circulation: R93.1

## 2021-09-21 HISTORY — DX: Aneurysm of the ascending aorta, without rupture: I71.21

## 2021-09-21 HISTORY — DX: Mixed hyperlipidemia: E78.2

## 2021-09-21 MED ORDER — METOPROLOL TARTRATE 25 MG PO TABS: 25.0000 mg | ORAL_TABLET | Freq: Every day | ORAL | 3 refills | Status: DC | PRN
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
## 2021-09-21 NOTE — Patient Instructions (Signed)

## 2021-09-30 ENCOUNTER — Other Ambulatory Visit: Payer: Self-pay

## 2021-09-30 ENCOUNTER — Telehealth: Payer: Self-pay | Admitting: Cardiology

## 2021-09-30 MED ORDER — ROSUVASTATIN CALCIUM 10 MG PO TABS: 10.0000 mg | ORAL_TABLET | Freq: Every day | ORAL | 3 refills | Status: DC

## 2021-09-30 NOTE — Telephone Encounter (Signed)
*  STAT* If patient is at the pharmacy, call can be transferred to refill team.   1. Which medications need to be refilled? (please list name of each medication and dose if known)   rosuvastatin (CRESTOR) 10 MG tablet    2. Which pharmacy/location (including street and city if local pharmacy) is medication to be sent to? [id number] - [place], [place] - [address]  3. Do they need a 30 day or 90 day supply?  90 day

## 2021-10-29 ENCOUNTER — Other Ambulatory Visit: Payer: Self-pay | Admitting: Cardiology

## 2021-12-28 ENCOUNTER — Encounter: Payer: Self-pay | Admitting: Plastic Surgery

## 2021-12-28 ENCOUNTER — Ambulatory Visit: Payer: BC Managed Care – PPO | Admitting: Plastic Surgery

## 2021-12-28 DIAGNOSIS — Z9889 Other specified postprocedural states: Secondary | ICD-10-CM

## 2021-12-28 DIAGNOSIS — M952 Other acquired deformity of head: Secondary | ICD-10-CM

## 2021-12-28 NOTE — Progress Notes (Signed)
   Subjective:    Patient ID: Tony Cook, male    DOB: 1965/04/26, 56 y.o.   MRN: [id number]  The patient is a 56 year old gentleman here for follow-up on his scalp wound.  He is completely healed today.  He still using the Adaptic and some gauze.  He does not have to but he certainly can if he wants.  There is no opening of the skin.  His forehead looks much better after the last surgery.  He is inquiring about his options for improving the area.  We talked about expander placement.  The patient does not want to go through that so he is to leave it as it is for right now.        Review of Systems  Constitutional: Negative.   Eyes: Negative.   Respiratory: Negative.    Cardiovascular: Negative.   Gastrointestinal: Negative.   Endocrine: Negative.   Genitourinary: Negative.        Objective:   Physical Exam Cardiovascular:     Rate and Rhythm: Normal rate.     Pulses: Normal pulses.  Pulmonary:     Effort: Pulmonary effort is normal.  Neurological:     Mental Status: He is alert and oriented to person, place, and time.  Psychiatric:        Mood and Affect: Mood normal.        Behavior: Behavior normal.        Assessment & Plan:     ICD-10-CM   1. Mohs defect of left scalp  M95.2    Z98.890        Pictures were obtained of the patient and placed in the chart with the patient's or guardian's permission.   Follow up as needed.  No intervention at this time.

## 2022-01-11 ENCOUNTER — Telehealth: Payer: Self-pay

## 2022-01-11 NOTE — Telephone Encounter (Signed)
Faxed wound care supply request to [facility] with confirmed receipt. Service has been provided.

## 2022-08-29 ENCOUNTER — Other Ambulatory Visit: Payer: Self-pay | Admitting: Cardiology

## 2022-09-23 ENCOUNTER — Ambulatory Visit: Payer: BC Managed Care – PPO | Attending: Cardiology | Admitting: Cardiology

## 2022-09-23 VITALS — BP 118/62 | HR 67 | Ht 68.0 in | Wt 159.2 lb

## 2022-09-23 DIAGNOSIS — E782 Mixed hyperlipidemia: Secondary | ICD-10-CM | POA: Diagnosis not present

## 2022-09-23 DIAGNOSIS — I7121 Aneurysm of the ascending aorta, without rupture: Secondary | ICD-10-CM | POA: Diagnosis not present

## 2022-09-23 DIAGNOSIS — I10 Essential (primary) hypertension: Secondary | ICD-10-CM | POA: Diagnosis not present

## 2022-09-23 DIAGNOSIS — R931 Abnormal findings on diagnostic imaging of heart and coronary circulation: Secondary | ICD-10-CM | POA: Diagnosis not present

## 2022-09-23 NOTE — Patient Instructions (Signed)
Medication Instructions:  Your physician recommends that you continue on your current medications as directed. Please refer to the Current Medication list given to you today.  *If you need a refill on your cardiac medications before your next appointment, please call your pharmacy*   Lab Work: Your physician recommends that you return for lab work in:   Labs today: BMP, CBC, TSH, LFT, Lipid  If you have labs (blood work) drawn today and your tests are completely normal, you will receive your results only by: MyChart Message (if you have MyChart) OR A paper copy in the mail If you have any lab test that is abnormal or we need to change your treatment, we will call you to review the results.   Testing/Procedures: CT-scan of the chest    Follow-Up: At [facility], you and your health needs are our priority.  As part of our continuing mission to provide you with exceptional heart care, we have created designated Provider Care Teams.  These Care Teams include your primary Cardiologist (physician) and Advanced Practice Providers (APPs -  Physician Assistants and Nurse Practitioners) who all work together to provide you with the care you need, when you need it.  We recommend signing up for the patient portal called "MyChart".  Sign up information is provided on this After Visit Summary.  MyChart is used to connect with patients for Virtual Visits (Telemedicine).  Patients are able to view lab/test results, encounter notes, upcoming appointments, etc.  Non-urgent messages can be sent to your provider as well.   To learn more about what you can do with MyChart, go to [email or web address].    Your next appointment:   9 month(s)  Provider:   Belva Crome, MD    Other Instructions None
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
## 2022-09-23 NOTE — Addendum Note (Signed)
Addended by: Roxanne Mins I on: 09/23/2022 03:54 PM   Modules accepted: Orders

## 2022-09-24 ENCOUNTER — Other Ambulatory Visit: Payer: Self-pay | Admitting: Cardiology

## 2022-09-24 IMAGING — DX DG CHEST 1V PORT
1 series · 1 of 1 positions shown · non-contrast
Comparison: None.

CLINICAL DATA: Palpitations, tachycardia

EXAM:
PORTABLE CHEST 1 VIEW

[chest ap]
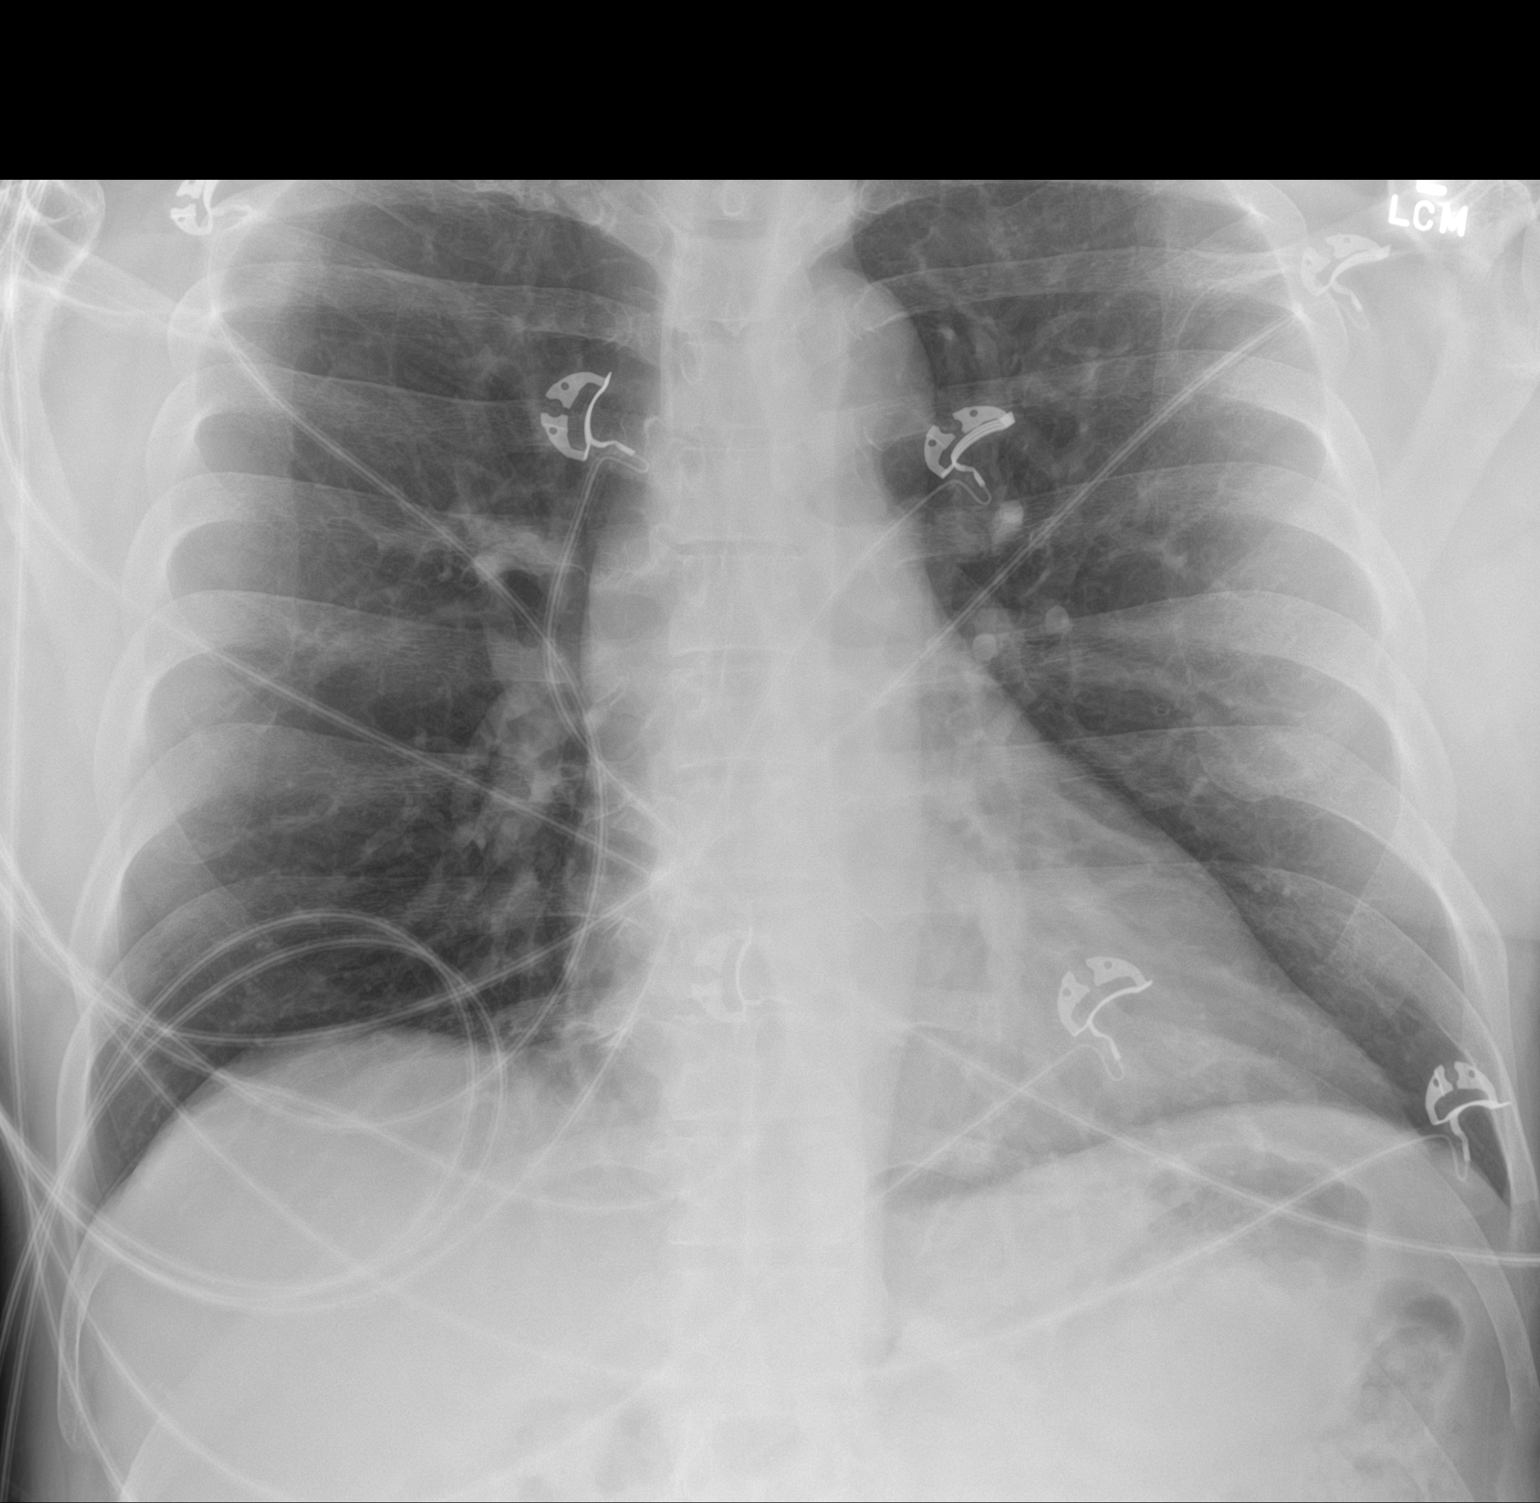

[1 of 1 positions shown; findings below may reference images not displayed]

FINDINGS: Single frontal view of the chest demonstrates an unremarkable
cardiac silhouette. No acute airspace disease, effusion, or
pneumothorax. No acute bony abnormality.
IMPRESSION: 1. No acute intrathoracic process.

## 2022-11-23 ENCOUNTER — Telehealth: Payer: Self-pay

## 2022-11-23 NOTE — Telephone Encounter (Signed)
Ascending aorta measures 4.0 cm. Will need yearly CT scan to monitor.

## 2022-11-23 NOTE — Telephone Encounter (Signed)
Viewed in MyChart   

## 2022-12-28 ENCOUNTER — Encounter: Payer: Self-pay | Admitting: Cardiology

## 2023-07-03 ENCOUNTER — Other Ambulatory Visit: Payer: Self-pay | Admitting: Cardiology

## 2023-08-17 ENCOUNTER — Telehealth: Payer: Self-pay

## 2023-08-17 NOTE — Telephone Encounter (Signed)
   Pre-operative Risk Assessment    Patient Name: Tony Cook  DOB: [birth year] MRN: [id number]   Date of last office visit: 09/23/22 Date of next office visit: N/A   Request for Surgical Clearance    Procedure:  open repair of left inguinal hernia with mesh  Date of Surgery:  Clearance 08/24/23                                Surgeon:  Dr. Nonda Bays Surgeon's Group or Practice Name:  [facility] Phone number:  [phone] Fax number:  [phone]   Type of Clearance Requested:   - Medical    Type of Anesthesia:  General    Additional requests/questions:    SignedBascom Lily   08/17/2023, 3:04 PM

## 2023-08-18 ENCOUNTER — Telehealth: Payer: Self-pay

## 2023-08-18 ENCOUNTER — Ambulatory Visit: Attending: Emergency Medicine | Admitting: Emergency Medicine

## 2023-08-18 DIAGNOSIS — Z01810 Encounter for preprocedural cardiovascular examination: Secondary | ICD-10-CM | POA: Diagnosis not present

## 2023-08-18 NOTE — Telephone Encounter (Signed)
 S/W pt and scheduled TELE Preop appt 08/18/23 Med Rec and Consent done... sam approved by MF

## 2023-08-18 NOTE — Telephone Encounter (Signed)
 Med Rec and Consent done... sam approved by MF     Patient Consent for Virtual Visit        Tony Cook has provided verbal consent on 08/18/2023 for a virtual visit (video or telephone).   CONSENT FOR VIRTUAL VISIT FOR:  Tony Cook  By participating in this virtual visit I agree to the following:  I hereby voluntarily request, consent and authorize [facility] and its employed or contracted physicians, physician assistants, nurse practitioners or other licensed health care professionals (the Practitioner), to provide me with telemedicine health care services (the "Services") as deemed necessary by the treating Practitioner. I acknowledge and consent to receive the Services by the Practitioner via telemedicine. I understand that the telemedicine visit will involve communicating with the Practitioner through live audiovisual communication technology and the disclosure of certain medical information by electronic transmission. I acknowledge that I have been given the opportunity to request an in-person assessment or other available alternative prior to the telemedicine visit and am voluntarily participating in the telemedicine visit.  I understand that I have the right to withhold or withdraw my consent to the use of telemedicine in the course of my care at any time, without affecting my right to future care or treatment, and that the Practitioner or I may terminate the telemedicine visit at any time. I understand that I have the right to inspect all information obtained and/or recorded in the course of the telemedicine visit and may receive copies of available information for a reasonable fee.  I understand that some of the potential risks of receiving the Services via telemedicine include:  Delay or interruption in medical evaluation due to technological equipment failure or disruption; Information transmitted may not be sufficient (e.g. poor resolution of images) to allow for  appropriate medical decision making by the Practitioner; and/or  In rare instances, security protocols could fail, causing a breach of personal health information.  Furthermore, I acknowledge that it is my responsibility to provide information about my medical history, conditions and care that is complete and accurate to the best of my ability. I acknowledge that Practitioner's advice, recommendations, and/or decision may be based on factors not within their control, such as incomplete or inaccurate data provided by me or distortions of diagnostic images or specimens that may result from electronic transmissions. I understand that the practice of medicine is not an exact science and that Practitioner makes no warranties or guarantees regarding treatment outcomes. I acknowledge that a copy of this consent can be made available to me via my patient portal [facility] MyChart), or I can request a printed copy by calling the office of [facility].    I understand that my insurance will be billed for this visit.   I have read or had this consent read to me. I understand the contents of this consent, which adequately explains the benefits and risks of the Services being provided via telemedicine.  I have been provided ample opportunity to ask questions regarding this consent and the Services and have had my questions answered to my satisfaction. I give my informed consent for the services to be provided through the use of telemedicine in my medical care

## 2023-08-18 NOTE — Telephone Encounter (Signed)
Patient returned Pre-op call. 

## 2023-08-18 NOTE — Telephone Encounter (Signed)
 Left message for pt to call our office and ask for the Preop team to schedule TELE Preop appt.
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
## 2023-08-18 NOTE — Telephone Encounter (Signed)
   Name: Tony Cook  DOB: 1966-03-05  MRN: [id number]  Primary Cardiologist: None   Preoperative team, please contact this patient and set up a phone call appointment for further preoperative risk assessment. Please obtain consent and complete medication review. Thank you for your help.  I confirm that guidance regarding antiplatelet and oral anticoagulation therapy has been completed and, if necessary, noted below (none requested).   I also confirmed the patient resides in the state of [place] . As per [facility] telemedicine laws, the patient must reside in the state in which the provider is licensed.   Jude Norton, NP 08/18/2023, 8:29 AM [facility]

## 2023-10-01 ENCOUNTER — Other Ambulatory Visit: Payer: Self-pay | Admitting: Cardiology

## 2023-10-26 ENCOUNTER — Other Ambulatory Visit: Payer: Self-pay | Admitting: Cardiology

## 2024-03-12 ENCOUNTER — Ambulatory Visit: Attending: Cardiology | Admitting: Cardiology

## 2024-03-12 ENCOUNTER — Encounter: Payer: Self-pay | Admitting: Cardiology

## 2024-03-12 VITALS — BP 130/86 | HR 68 | Ht 67.0 in | Wt 166.6 lb

## 2024-03-12 DIAGNOSIS — I10 Essential (primary) hypertension: Secondary | ICD-10-CM

## 2024-03-12 DIAGNOSIS — R931 Abnormal findings on diagnostic imaging of heart and coronary circulation: Secondary | ICD-10-CM | POA: Diagnosis not present

## 2024-03-12 DIAGNOSIS — Z131 Encounter for screening for diabetes mellitus: Secondary | ICD-10-CM

## 2024-03-12 DIAGNOSIS — Z1321 Encounter for screening for nutritional disorder: Secondary | ICD-10-CM

## 2024-03-12 DIAGNOSIS — E782 Mixed hyperlipidemia: Secondary | ICD-10-CM

## 2024-03-12 DIAGNOSIS — I7121 Aneurysm of the ascending aorta, without rupture: Secondary | ICD-10-CM | POA: Diagnosis not present
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
## 2024-03-12 NOTE — Patient Instructions (Addendum)
 Medication Instructions:  Your physician recommends that you continue on your current medications as directed. Please refer to the Current Medication list given to you today.  *If you need a refill on your cardiac medications before your next appointment, please call your pharmacy*   Lab Work: Your physician recommends that you have a CMP, CBC, TSH, A1C, vitamin D and lipids today in the office.  If you have labs (blood work) drawn today and your tests are completely normal, you will receive your results only by: MyChart Message (if you have MyChart) OR A paper copy in the mail If you have any lab test that is abnormal or we need to change your treatment, we will call you to review the results.   Testing/Procedures:    [facility] [address] [place], [place] [address] Phone:  [phone]  Stress Echocardiogram Instructions:    1. You may take all of your medications except Metoprolol (Lopressor ).  2. No food, drink or tobacco products four hours prior to your test.  3. Dress prepared to exercise. Best to wear 2 piece outfit and tennis shoes. Shoes must be closed toe.  4. Please bring all current prescription medications.  Please report to [address] for your test.  If you have questions or concerns about your appointment, you can call the [facility] [place] [facility] at [phone].  If you cannot keep your appointment, please provide 24 hours notification to the Nuclear Lab, to avoid a possible $50 charge to your account.  Non-Cardiac CT scanning, (CAT scanning), is a noninvasive, special x-ray that produces cross-sectional images of the body using x-rays and a computer. CT scans help physicians diagnose and treat medical conditions. For some CT exams, a contrast material is used to enhance visibility in the area of the body being studied. CT scans provide greater clarity and reveal more details than regular x-ray  exams.  [facility] at [facility] [address] [place], [place] [address] [phone]   Follow-Up: At [facility], you and your health needs are our priority.  As part of our continuing mission to provide you with exceptional heart care, we have created designated Provider Care Teams.  These Care Teams include your primary Cardiologist (physician) and Advanced Practice Providers (APPs -  Physician Assistants and Nurse Practitioners) who all work together to provide you with the care you need, when you need it.  We recommend signing up for the patient portal called MyChart.  Sign up information is provided on this After Visit Summary.  MyChart is used to connect with patients for Virtual Visits (Telemedicine).  Patients are able to view lab/test results, encounter notes, upcoming appointments, etc.  Non-urgent messages can be sent to your provider as well.   To learn more about what you can do with MyChart, go to [email or web address].    Your next appointment:   9 month(s)  The format for your next appointment:   In Person  Provider:   Jennifer Crape, MD   Other Instructions Exercise Stress Echocardiogram An exercise stress echocardiogram is a test to check how well your heart is working. This test uses sound waves and a computer to make pictures of your heart. These pictures will be taken before and after you exercise. For this test, you will walk on a treadmill or ride a bicycle to make your heart beat faster. While you exercise, your heart will be checked with an electrocardiogram (ECG). Your blood pressure will  also be checked. You may have this test if: You have chest pain or a heart problem. You had a heart attack or heart surgery not long ago. You have heart valve problems. You have a condition that causes narrowing of the blood vessels that supply your heart. You have a high risk of heart disease and: You are starting a new exercise  program. You need to have a big surgery. Tell a doctor about: Any allergies you have. All medicines you are taking. This includes vitamins, herbs, eye drops, creams, and over-the-counter medicines. Any problems you or family members have had with medicines that make you fall asleep (anesthetic medicines). Any surgeries you have had. Any blood disorders you have. Any medical conditions you have. Whether you are pregnant or may be pregnant. What are the risks? Generally, this is a safe test. However, problems may occur, including: Chest pain. Feeling dizzy or light-headed. Shortness of breath. Increased or irregular heartbeat. Feeling like you may vomit (nausea) or vomiting. Heart attack. This is very rare. What happens before the test? Medicines Ask your doctor about changing or stopping your normal medicines. This is important if you take diabetes medicines or blood thinners. If you use an inhaler, bring it to the test. General instructions Wear comfortable clothes and walking shoes. Follow instructions from your doctor about what you cannot eat or drink before the test. Do not drink or eat anything that has caffeine in it. Stop having caffeine 24 hours before the test. Do not smoke or use products that contain nicotine or tobacco for 4 hours before the test. If you need help quitting, ask your doctor. What happens during the test?  You will take off your clothes from the waist up and put on a hospital gown. Electrodes or patches will be put on your chest. A blood pressure cuff will be put on your arm. Before you exercise, a computer will make a picture of your heart. To do this: You will lie down and a gel will be put on your chest. A wand will be moved over the gel. Sound waves from the wand will go to the computer to make the picture. Then, you will start to exercise. You may walk on a treadmill or pedal a bicycle. Your blood pressure and heart rhythm will be checked while you  exercise. The exercise will get harder or faster. You will exercise until: Your heart reaches a certain level. You are too tired to go on. You cannot go on because of chest pain, weakness, or dizziness. You will lie down right away so another picture of your heart can be taken. The procedure may vary among doctors and hospitals. What can I expect after the test? After your test, it is common to have: Mild soreness. Mild tiredness. Your heart rate and blood pressure will be checked until they return to your normal levels. You should not have any new symptoms after this test. Follow these instructions at home: If your doctor says that you can, you may: Eat what you normally eat. Do your normal activities. Take over-the-counter and prescription medicines only as told by your doctor. Keep all follow-up visits. It is up to you to get the results of your test. Ask how to get your results when they are ready. Contact a doctor if: You feel dizzy or light-headed. You have a fast or irregular heartbeat. You feel like you may vomit or you vomit. You have a headache. You feel short of breath. Get help  right away if: You develop pain or pressure: In your chest. In your jaw or neck. Between your shoulders. That goes down your left arm. You faint. You have trouble breathing. These symptoms may be an emergency. Get medical help right away. Call your local emergency services (911 in the [place] Do not wait to see if the symptoms will go away. Do not drive yourself to the hospital. Summary This is a test that checks how well your heart is working. Follow instructions about what you cannot eat or drink before the test. Ask your doctor if you should take your normal medicines before the test. Stop having caffeine 24 hours before the test. Do not smoke or use products with nicotine or tobacco in them for 4 hours before the test. During the test, your blood pressure and heart rhythm will be  checked while you exercise. This information is not intended to replace advice given to you by your health care provider. Make sure you discuss any questions you have with your health care provider. Document Revised: 11/11/2020 Document Reviewed: 10/22/2019 Elsevier Patient Education  2022 [employer].

## 2024-03-13 ENCOUNTER — Ambulatory Visit: Payer: Self-pay | Admitting: Cardiology

## 2024-03-13 DIAGNOSIS — E782 Mixed hyperlipidemia: Secondary | ICD-10-CM

## 2024-03-13 DIAGNOSIS — R7309 Other abnormal glucose: Secondary | ICD-10-CM

## 2024-03-13 DIAGNOSIS — R931 Abnormal findings on diagnostic imaging of heart and coronary circulation: Secondary | ICD-10-CM

## 2024-03-13 LAB — CBC
Hematocrit: 43.9 % (ref 37.5–51.0)
Hemoglobin: 15 g/dL (ref 13.0–17.7)
MCH: 33.3 pg — ABNORMAL HIGH (ref 26.6–33.0)
MCHC: 34.2 g/dL (ref 31.5–35.7)
MCV: 97 fL (ref 79–97)
Platelets: 266 x10E3/uL (ref 150–450)
RBC: 4.51 x10E6/uL (ref 4.14–5.80)
RDW: 12.3 % (ref 11.6–15.4)
WBC: 5.5 x10E3/uL (ref 3.4–10.8)

## 2024-03-13 LAB — COMPREHENSIVE METABOLIC PANEL WITH GFR
ALT: 12 IU/L (ref 0–44)
AST: 17 IU/L (ref 0–40)
Albumin: 4.4 g/dL (ref 3.8–4.9)
Alkaline Phosphatase: 54 IU/L (ref 47–123)
BUN/Creatinine Ratio: 20 (ref 10–24)
BUN: 22 mg/dL (ref 8–27)
Bilirubin Total: 0.3 mg/dL (ref 0.0–1.2)
CO2: 23 mmol/L (ref 20–29)
Calcium: 10.1 mg/dL (ref 8.6–10.2)
Chloride: 101 mmol/L (ref 96–106)
Creatinine, Ser: 1.1 mg/dL (ref 0.76–1.27)
Globulin, Total: 2.5 g/dL (ref 1.5–4.5)
Glucose: 103 mg/dL — ABNORMAL HIGH (ref 70–99)
Potassium: 5.2 mmol/L (ref 3.5–5.2)
Sodium: 139 mmol/L (ref 134–144)
Total Protein: 6.9 g/dL (ref 6.0–8.5)
eGFR: 77 mL/min/1.73

## 2024-03-13 LAB — LIPID PANEL
Chol/HDL Ratio: 3.7 ratio (ref 0.0–5.0)
Cholesterol, Total: 265 mg/dL — ABNORMAL HIGH (ref 100–199)
HDL: 71 mg/dL
LDL Chol Calc (NIH): 176 mg/dL — ABNORMAL HIGH (ref 0–99)
Triglycerides: 104 mg/dL (ref 0–149)
VLDL Cholesterol Cal: 18 mg/dL (ref 5–40)

## 2024-03-13 LAB — HEMOGLOBIN A1C
Est. average glucose Bld gHb Est-mCnc: 117 mg/dL
Hgb A1c MFr Bld: 5.7 % — ABNORMAL HIGH (ref 4.8–5.6)

## 2024-03-13 LAB — TSH: TSH: 2.33 u[IU]/mL (ref 0.450–4.500)

## 2024-03-13 LAB — VITAMIN D 25 HYDROXY (VIT D DEFICIENCY, FRACTURES): Vit D, 25-Hydroxy: 51.8 ng/mL (ref 30.0–100.0)

## 2024-03-13 MED ORDER — ROSUVASTATIN CALCIUM 10 MG PO TABS: 10.0000 mg | ORAL_TABLET | Freq: Every day | ORAL | 3 refills | Status: AC

## 2024-03-13 NOTE — Telephone Encounter (Signed)
Results reviewed with pt as per Dr. Revankar's note.  Pt verbalized understanding and had no additional questions. Routed to PCP.  

## 2024-03-13 NOTE — Telephone Encounter (Signed)
-----   Message from Jennifer Crape, MD sent at 03/13/2024  8:11 AM EST ----- Please send him to dietitian.  Also make sure he takes statin on a regular basis and come back for blood work in 4 to 5 weeks.  Copy primary lipids are markedly elevated [name], MD 03/13/2024 8:10 AM

## 2024-04-05 ENCOUNTER — Ambulatory Visit (HOSPITAL_BASED_OUTPATIENT_CLINIC_OR_DEPARTMENT_OTHER): Admission: RE | Admit: 2024-04-05 | Source: Ambulatory Visit | Admitting: Radiology

## 2024-04-05 DIAGNOSIS — I7121 Aneurysm of the ascending aorta, without rupture: Secondary | ICD-10-CM

## 2024-05-03 ENCOUNTER — Encounter: Admitting: Dietician

## 2024-05-15 ENCOUNTER — Ambulatory Visit

## 2024-05-15 ENCOUNTER — Other Ambulatory Visit
